# Patient Record
Sex: Female | Born: 1965 | Race: White | Hispanic: No | Marital: Married | Smoking: Never smoker
Health system: Southern US, Community
[De-identification: ages and names within clinical notes are randomized; demographics above are authoritative.]

## PROBLEM LIST (undated history)

## (undated) DIAGNOSIS — E7800 Pure hypercholesterolemia, unspecified: Secondary | ICD-10-CM

## (undated) DIAGNOSIS — E079 Disorder of thyroid, unspecified: Secondary | ICD-10-CM

## (undated) DIAGNOSIS — I471 Supraventricular tachycardia, unspecified: Secondary | ICD-10-CM

## (undated) DIAGNOSIS — K219 Gastro-esophageal reflux disease without esophagitis: Secondary | ICD-10-CM

## (undated) DIAGNOSIS — D126 Benign neoplasm of colon, unspecified: Secondary | ICD-10-CM

## (undated) HISTORY — DX: Disorder of thyroid, unspecified: E07.9

## (undated) HISTORY — DX: Gastro-esophageal reflux disease without esophagitis: K21.9

## (undated) HISTORY — PX: BREAST SURGERY: SHX581

## (undated) HISTORY — DX: Supraventricular tachycardia, unspecified: I47.10

## (undated) HISTORY — DX: Pure hypercholesterolemia, unspecified: E78.00

## (undated) HISTORY — DX: Benign neoplasm of colon, unspecified: D12.6

---

## 1998-10-02 ENCOUNTER — Other Ambulatory Visit: Admission: RE | Admit: 1998-10-02 | Discharge: 1998-10-02 | Payer: Self-pay | Admitting: Gynecology

## 1999-03-26 ENCOUNTER — Ambulatory Visit (HOSPITAL_COMMUNITY): Admission: RE | Admit: 1999-03-26 | Discharge: 1999-03-26 | Payer: Self-pay | Admitting: Internal Medicine

## 1999-10-16 ENCOUNTER — Other Ambulatory Visit: Admission: RE | Admit: 1999-10-16 | Discharge: 1999-10-16 | Payer: Self-pay | Admitting: Gynecology

## 2000-05-23 ENCOUNTER — Other Ambulatory Visit: Admission: RE | Admit: 2000-05-23 | Discharge: 2000-05-23 | Payer: Self-pay | Admitting: Gynecology

## 2000-12-22 ENCOUNTER — Emergency Department (HOSPITAL_COMMUNITY): Admission: EM | Admit: 2000-12-22 | Discharge: 2000-12-22 | Payer: Self-pay | Admitting: Emergency Medicine

## 2000-12-23 ENCOUNTER — Inpatient Hospital Stay (HOSPITAL_COMMUNITY): Admission: AD | Admit: 2000-12-23 | Discharge: 2000-12-25 | Payer: Self-pay | Admitting: Gynecology

## 2000-12-23 ENCOUNTER — Inpatient Hospital Stay (HOSPITAL_COMMUNITY): Admission: AD | Admit: 2000-12-23 | Discharge: 2000-12-23 | Payer: Self-pay | Admitting: Gynecology

## 2001-02-06 ENCOUNTER — Other Ambulatory Visit: Admission: RE | Admit: 2001-02-06 | Discharge: 2001-02-06 | Payer: Self-pay | Admitting: *Deleted

## 2001-08-07 ENCOUNTER — Ambulatory Visit (HOSPITAL_BASED_OUTPATIENT_CLINIC_OR_DEPARTMENT_OTHER): Admission: RE | Admit: 2001-08-07 | Discharge: 2001-08-07 | Payer: Self-pay | Admitting: Surgery

## 2001-08-07 ENCOUNTER — Encounter (INDEPENDENT_AMBULATORY_CARE_PROVIDER_SITE_OTHER): Payer: Self-pay | Admitting: *Deleted

## 2001-08-07 HISTORY — PX: BREAST EXCISIONAL BIOPSY: SUR124

## 2002-04-09 ENCOUNTER — Other Ambulatory Visit: Admission: RE | Admit: 2002-04-09 | Discharge: 2002-04-09 | Payer: Self-pay | Admitting: *Deleted

## 2003-02-11 ENCOUNTER — Encounter: Payer: Self-pay | Admitting: Surgery

## 2003-02-11 ENCOUNTER — Encounter: Admission: RE | Admit: 2003-02-11 | Discharge: 2003-02-11 | Payer: Self-pay | Admitting: Surgery

## 2003-04-16 ENCOUNTER — Other Ambulatory Visit: Admission: RE | Admit: 2003-04-16 | Discharge: 2003-04-16 | Payer: Self-pay | Admitting: Gynecology

## 2003-08-20 ENCOUNTER — Encounter: Admission: RE | Admit: 2003-08-20 | Discharge: 2003-08-20 | Payer: Self-pay | Admitting: Surgery

## 2003-08-20 ENCOUNTER — Encounter (INDEPENDENT_AMBULATORY_CARE_PROVIDER_SITE_OTHER): Payer: Self-pay | Admitting: Specialist

## 2003-08-20 HISTORY — PX: BREAST BIOPSY: SHX20

## 2004-04-27 ENCOUNTER — Other Ambulatory Visit: Admission: RE | Admit: 2004-04-27 | Discharge: 2004-04-27 | Payer: Self-pay | Admitting: Gynecology

## 2004-05-01 ENCOUNTER — Encounter: Admission: RE | Admit: 2004-05-01 | Discharge: 2004-05-01 | Payer: Self-pay | Admitting: Gynecology

## 2004-07-28 ENCOUNTER — Encounter: Admission: RE | Admit: 2004-07-28 | Discharge: 2004-07-28 | Payer: Self-pay | Admitting: Surgery

## 2005-04-29 ENCOUNTER — Other Ambulatory Visit: Admission: RE | Admit: 2005-04-29 | Discharge: 2005-04-29 | Payer: Self-pay | Admitting: Gynecology

## 2005-09-14 ENCOUNTER — Encounter: Admission: RE | Admit: 2005-09-14 | Discharge: 2005-09-14 | Payer: Self-pay | Admitting: Gynecology

## 2006-04-26 ENCOUNTER — Other Ambulatory Visit: Admission: RE | Admit: 2006-04-26 | Discharge: 2006-04-26 | Payer: Self-pay | Admitting: Gynecology

## 2006-11-02 ENCOUNTER — Encounter: Admission: RE | Admit: 2006-11-02 | Discharge: 2006-11-02 | Payer: Self-pay | Admitting: Family Medicine

## 2007-05-01 ENCOUNTER — Other Ambulatory Visit: Admission: RE | Admit: 2007-05-01 | Discharge: 2007-05-01 | Payer: Self-pay | Admitting: Gynecology

## 2008-01-03 ENCOUNTER — Encounter: Admission: RE | Admit: 2008-01-03 | Discharge: 2008-01-03 | Payer: Self-pay | Admitting: Gynecology

## 2008-05-10 ENCOUNTER — Ambulatory Visit: Payer: Self-pay | Admitting: Women's Health

## 2008-05-10 ENCOUNTER — Encounter: Payer: Self-pay | Admitting: Women's Health

## 2008-05-10 ENCOUNTER — Other Ambulatory Visit: Admission: RE | Admit: 2008-05-10 | Discharge: 2008-05-10 | Payer: Self-pay | Admitting: Gynecology

## 2008-07-30 ENCOUNTER — Ambulatory Visit: Payer: Self-pay | Admitting: Women's Health

## 2009-01-06 ENCOUNTER — Encounter: Admission: RE | Admit: 2009-01-06 | Discharge: 2009-01-06 | Payer: Self-pay | Admitting: Family Medicine

## 2009-05-20 ENCOUNTER — Encounter: Admission: RE | Admit: 2009-05-20 | Discharge: 2009-05-20 | Payer: Self-pay | Admitting: Family Medicine

## 2009-06-03 ENCOUNTER — Encounter (INDEPENDENT_AMBULATORY_CARE_PROVIDER_SITE_OTHER): Payer: Self-pay | Admitting: Interventional Radiology

## 2009-06-03 ENCOUNTER — Encounter: Admission: RE | Admit: 2009-06-03 | Discharge: 2009-06-03 | Payer: Self-pay | Admitting: Family Medicine

## 2009-06-03 ENCOUNTER — Other Ambulatory Visit: Admission: RE | Admit: 2009-06-03 | Discharge: 2009-06-03 | Payer: Self-pay | Admitting: Diagnostic Radiology

## 2009-06-17 ENCOUNTER — Ambulatory Visit: Payer: Self-pay | Admitting: Women's Health

## 2009-06-17 ENCOUNTER — Other Ambulatory Visit: Admission: RE | Admit: 2009-06-17 | Discharge: 2009-06-17 | Payer: Self-pay | Admitting: Gynecology

## 2009-06-17 ENCOUNTER — Encounter: Payer: Self-pay | Admitting: Women's Health

## 2009-08-07 ENCOUNTER — Encounter (HOSPITAL_COMMUNITY): Admission: RE | Admit: 2009-08-07 | Discharge: 2009-10-06 | Payer: Self-pay | Admitting: Internal Medicine

## 2009-09-30 HISTORY — PX: THYROIDECTOMY, PARTIAL: SHX18

## 2009-10-16 ENCOUNTER — Encounter (INDEPENDENT_AMBULATORY_CARE_PROVIDER_SITE_OTHER): Payer: Self-pay | Admitting: Surgery

## 2009-10-16 ENCOUNTER — Ambulatory Visit (HOSPITAL_COMMUNITY): Admission: RE | Admit: 2009-10-16 | Discharge: 2009-10-17 | Payer: Self-pay | Admitting: Surgery

## 2009-11-04 ENCOUNTER — Ambulatory Visit: Payer: Self-pay | Admitting: Gynecology

## 2010-01-12 ENCOUNTER — Encounter: Admission: RE | Admit: 2010-01-12 | Discharge: 2010-01-12 | Payer: Self-pay | Admitting: Family Medicine

## 2010-01-14 ENCOUNTER — Encounter: Admission: RE | Admit: 2010-01-14 | Discharge: 2010-01-14 | Payer: Self-pay | Admitting: Family Medicine

## 2010-08-23 ENCOUNTER — Encounter: Payer: Self-pay | Admitting: Family Medicine

## 2010-09-07 ENCOUNTER — Encounter: Payer: BC Managed Care – PPO | Admitting: Women's Health

## 2010-09-07 ENCOUNTER — Other Ambulatory Visit: Payer: Self-pay | Admitting: Women's Health

## 2010-09-07 ENCOUNTER — Other Ambulatory Visit (HOSPITAL_COMMUNITY)
Admission: RE | Admit: 2010-09-07 | Discharge: 2010-09-07 | Disposition: A | Discharge status: Home / Self Care | Payer: BC Managed Care – PPO | Source: Ambulatory Visit | Attending: Gynecology | Admitting: Gynecology

## 2010-09-07 DIAGNOSIS — Z01419 Encounter for gynecological examination (general) (routine) without abnormal findings: Secondary | ICD-10-CM

## 2010-09-07 DIAGNOSIS — Z124 Encounter for screening for malignant neoplasm of cervix: Secondary | ICD-10-CM | POA: Insufficient documentation

## 2010-10-26 LAB — DIFFERENTIAL
Basophils Absolute: 0 10*3/uL (ref 0.0–0.1)
Basophils Relative: 1 % (ref 0–1)
Eosinophils Absolute: 0.2 10*3/uL (ref 0.0–0.7)
Eosinophils Relative: 7 % — ABNORMAL HIGH (ref 0–5)
Lymphocytes Relative: 30 % (ref 12–46)
Lymphs Abs: 1 10*3/uL (ref 0.7–4.0)
Monocytes Absolute: 0.3 10*3/uL (ref 0.1–1.0)
Monocytes Relative: 8 % (ref 3–12)
Neutro Abs: 1.8 10*3/uL (ref 1.7–7.7)
Neutrophils Relative %: 54 % (ref 43–77)

## 2010-10-26 LAB — CBC
HCT: 39.8 % (ref 36.0–46.0)
Hemoglobin: 13.1 g/dL (ref 12.0–15.0)
MCHC: 32.8 g/dL (ref 30.0–36.0)
MCV: 93.7 fL (ref 78.0–100.0)
Platelets: 407 10*3/uL — ABNORMAL HIGH (ref 150–400)
RBC: 4.24 MIL/uL (ref 3.87–5.11)
RDW: 12.2 % (ref 11.5–15.5)
WBC: 3.4 10*3/uL — ABNORMAL LOW (ref 4.0–10.5)

## 2010-10-26 LAB — URINALYSIS, ROUTINE W REFLEX MICROSCOPIC
Bilirubin Urine: NEGATIVE
Glucose, UA: NEGATIVE mg/dL
Hgb urine dipstick: NEGATIVE
Ketones, ur: NEGATIVE mg/dL
Nitrite: NEGATIVE
Protein, ur: NEGATIVE mg/dL
Specific Gravity, Urine: 1.007 (ref 1.005–1.030)
Urobilinogen, UA: 0.2 mg/dL (ref 0.0–1.0)
pH: 7 (ref 5.0–8.0)

## 2010-10-26 LAB — PREGNANCY, URINE: Preg Test, Ur: NEGATIVE

## 2010-10-26 LAB — PROTIME-INR
INR: 1.01 (ref 0.00–1.49)
Prothrombin Time: 13.2 seconds (ref 11.6–15.2)

## 2010-10-26 LAB — BASIC METABOLIC PANEL
BUN: 11 mg/dL (ref 6–23)
CO2: 29 mEq/L (ref 19–32)
Calcium: 9.2 mg/dL (ref 8.4–10.5)
Chloride: 105 mEq/L (ref 96–112)
Creatinine, Ser: 0.87 mg/dL (ref 0.4–1.2)
GFR calc Af Amer: >60 mL/min (ref >60)
GFR calc non Af Amer: >60 mL/min (ref >60)
Glucose, Bld: 85 mg/dL (ref 70–99)
Potassium: 3.7 mEq/L (ref 3.5–5.1)
Sodium: 140 mEq/L (ref 135–145)

## 2010-12-18 NOTE — Op Note (Signed)
Hayley Stewart. [facility]  Patient:    [name], [name] Visit Number: [id number] MRN: [id number]          Service Type: DSU Location: [facility] Attending Physician:  Katha Cabal Dictated by:   Thornton Park Daphine Deutscher, M.D. Proc. Date: 08/17/01 Admit Date:  08/07/2001   CC:         Neta Mends. Panosh, M.D. [facility]  Timothy P. Fontaine, M.D.   Operative Report  PREOPERATIVE DIAGNOSIS:  Probable mass right breast at 6 oclock position.  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  SURGEON:  Thornton Park. Daphine Deutscher, M.D.  ANESTHESIA:  MAC  DESCRIPTION OF PROCEDURE:  [name] is a 45 year old lady with a palpable mass in the 6 oclock position in the retroareolar region on the right side. The area was localized with her assistance in operating room #8 and then she was given intravenous sedation.  The right breast was prepped with Betadine and draped sterilely.  I infiltrated the nipple with 1% Lidocaine and then made a small curvilinear incision.  I went down through the periareolar tissue.  I could palpate the small mass and dissected it free from the surrounding tissue.  Grossly it appeared to be a fibroadenoma.  It was sent in toto for permanent sections.  Bleeding was controlled with the electrocautery and then the wound was closed with 4-0 Vicryl subcutaneously and then with 5-0 subcuticular Vicryl with Benzoin and Steri-Strips on the skin.  The patient will be given Percocet 7.5/325 for pain and will be followed up in the office in two weeks. Dictated by:   Thornton Park Daphine Deutscher, M.D. Attending Physician:  Katha Cabal DD:  08/07/00 TD:  08/07/01 Job: [phone] [id number]

## 2010-12-18 NOTE — Discharge Summary (Signed)
[facility]  Patient:    Hayley Stewart, Hayley Stewart                        MRN: [id number] Adm. Date:  12/23/00 Disc. Date: 12/25/00 Attending:  Wetzel Bjornstad Dictator:   Antony Contras, N.P.                           Discharge Summary  DISCHARGE DIAGNOSES:          Intrauterine pregnancy at 41 weeks, prodromal labor, thin meconium, positive GBS.  PROCEDURE:                    Induction of labor, amnioinfusion, normal spontaneous vaginal delivery of viable over midline episiotomy with fourth degree laceration.  HISTORY OF PRESENT ILLNESS:   Patient is a 45 year old gravida 2, para 0-0-1-0 with LMP March 11, 2000, [facility] Dec 11, 2000.  Prenatal risk factors include history of advanced maternal age.  Also, patient had a benign breast nodule which was evaluated by Dr. Wenda Low.  PRENATAL LABORATORIES:        Blood type B+.  Antibody screen negative.  RPR, HBSAG, HIV nonreactive.  Rubella immune.  GBS positive.  MSAFP and amniocentesis normal.  HOSPITAL COURSE:              Patient was admitted for induction of labor at 41 weeks secondary to post dates, history of prolonged prodromal labor. Cervix on admission was 1 cm, 90%, -3.  Artificial rupture of membranes revealed thin meconium.  Patient did receive amnioinfusion.  She did progress to complete dilatation.  Was delivered of an Apgar 8/9 female infant weighing 6 pounds 8 ounces over midline episiotomy with a fourth degree laceration. Placenta was delivered intact.  Postpartum patient remained afebrile, was able to be discharged on her second postpartum day in satisfactory condition.  LABORATORIES:                 CBC:  Hematocrit 29.7, hemoglobin 10.3, WBC 12.9, platelets 296,000.  DISPOSITION:                  Follow-up in six weeks.  Continue prenatal vitamins and iron, Motrin and Tylox for pain, Colace and Metamucil, sitz baths for fourth degree. DD:  01/09/01 TD:  01/09/01 Job:  [id number]

## 2011-05-18 ENCOUNTER — Other Ambulatory Visit: Payer: Self-pay | Admitting: Gynecology

## 2011-05-18 DIAGNOSIS — Z1231 Encounter for screening mammogram for malignant neoplasm of breast: Secondary | ICD-10-CM

## 2011-05-31 ENCOUNTER — Ambulatory Visit (INDEPENDENT_AMBULATORY_CARE_PROVIDER_SITE_OTHER): Payer: BC Managed Care – PPO | Admitting: Women's Health

## 2011-05-31 ENCOUNTER — Encounter: Payer: Self-pay | Admitting: Women's Health

## 2011-05-31 VITALS — BP 110/70

## 2011-05-31 DIAGNOSIS — E041 Nontoxic single thyroid nodule: Secondary | ICD-10-CM | POA: Insufficient documentation

## 2011-05-31 DIAGNOSIS — B373 Candidiasis of vulva and vagina: Secondary | ICD-10-CM

## 2011-05-31 DIAGNOSIS — E079 Disorder of thyroid, unspecified: Secondary | ICD-10-CM | POA: Insufficient documentation

## 2011-05-31 DIAGNOSIS — B3731 Acute candidiasis of vulva and vagina: Secondary | ICD-10-CM

## 2011-05-31 DIAGNOSIS — R350 Frequency of micturition: Secondary | ICD-10-CM

## 2011-05-31 MED ORDER — FLUCONAZOLE 150 MG PO TABS: 150.0000 mg | ORAL_TABLET | Freq: Once | ORAL | Status: AC

## 2011-05-31 NOTE — Progress Notes (Signed)
  Presents with a complaint of urinary frequency and urgency for 2 days. Denies any burning, pain, or pain at end of stream. Denies fever or discharge. Symptoms started after intercourse.  Exam: No CVAT, UA completely negative. Abdomen was soft nontender, speculum exam moderate amount of white discharge was noted. Wet prep positive for yeast. Bimanual no CMT or adnexal fullness or tenderness. Contraceptives with vasectomy.  Plan: Urine culture. Diflucan 150 by mouth x1 dose with a refill. Yeast prevention discussed. Instructed to  call if symptoms do not resolve.

## 2011-06-07 ENCOUNTER — Ambulatory Visit
Admission: RE | Admit: 2011-06-07 | Discharge: 2011-06-07 | Disposition: A | Discharge status: Home / Self Care | Payer: BC Managed Care – PPO | Source: Ambulatory Visit | Attending: Gynecology | Admitting: Gynecology

## 2011-06-07 DIAGNOSIS — Z1231 Encounter for screening mammogram for malignant neoplasm of breast: Secondary | ICD-10-CM

## 2011-06-23 ENCOUNTER — Ambulatory Visit (INDEPENDENT_AMBULATORY_CARE_PROVIDER_SITE_OTHER): Payer: BC Managed Care – PPO | Admitting: Family Medicine

## 2011-06-23 ENCOUNTER — Encounter: Payer: Self-pay | Admitting: Family Medicine

## 2011-06-23 VITALS — BP 120/76 | HR 68 | Ht 62.5 in | Wt 124.0 lb

## 2011-06-23 DIAGNOSIS — E039 Hypothyroidism, unspecified: Secondary | ICD-10-CM

## 2011-06-23 NOTE — Progress Notes (Signed)
Patient had a partial thyroidectomy and has been under the care of Dr. Sharl Ma.  She has gained 10 pounds since her surgery, 3 pounds since August.  She feels her concerns about her weight aren't being addressed.  Diet and exercise have not changed.  Continues to run and mountain bike 4-5 days/week, and eats healthy, no change in diet.  Denies any increase in weight training. Had to buy bigger pants, and even these are now getting tighter.  Complains of constipation, continued weight gain.   Had a visit with Dr. Sharl Ma 10/1 TSH 1.77, free T4 1.22--her dose prior to visit was 4 days/week of , and 3 days/week of Her dose was then changed to 75 mcg every day.  11/15 TSH 2.96, free T4 0.95.  Denies any missed doses.  Takes it on empty stomach with no other medications. She got a call from the nurse, and recommends increasing dose to 88 mcg.  She is wondering why the TSH got higher, despite higher dose of thyroid medication.  She is wondering if increasing to is the right thing to do, seeking my opinion.  Med was called to pharmacy yesterday, hasn't picked it up yet.  Past Medical History  Diagnosis Date  . Thyroid disease 09/2009    hypothyroidism post op  . Osteoporosis 2004  . 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 Breast surgery     right breast biospy  . Thyroidectomy, partial 09/2009    History   Social History  . Marital Status: Married    Spouse Name: N/A    Number of Children: 1  . Years of Education: N/A   Occupational History  . Not on file.   Social History Main Topics  . Smoking status: Never Smoker   . Smokeless tobacco: Never Used  . Alcohol Use: Yes     1 drink per month.  . Drug Use: No  . Sexually Active: Yes -- Female partner(s)    Birth Control/ Protection: Surgical     husband  vasectomy   Other Topics Concern  . Not on file   Social History Narrative  . No narrative on file    Family History    Problem Relation Age of Onset  . Hypertension Mother   . Hyperlipidemia Mother   . Heart disease Maternal Grandfather     Current outpatient prescriptions:Calcium Carbonate-Vitamin D (CALCIUM 600+D) 600-400 MG-UNIT per tablet, Take 1 tablet by mouth daily.  , Disp: , Rfl: ;  levothyroxine (SYNTHROID, LEVOTHROID) 75 MCG tablet, Take 75 mcg by mouth daily.  , Disp: , Rfl:   Allergies  Allergen Reactions  . Macrobid Nausea And Vomiting  . Penicillins Other (See Comments)    Historical from mother.   ROS:  Denies fevers, URI symptoms, chest pain, shortness of breath, edema, skin concerns.  She reports that her energy is good, no trouble sleeping.  Denies skin or hair changes, moods are good.  Periods have been abnormal, ranging from 21-28 days, and sometimes is heavy. Denies GI or GU complaints, joint complaints, depression or other concerns  PHYSICAL EXAM: BP 120/76  Pulse 68  Ht 5' 2.5" (1.588 m)  Wt 124 lb (56.246 kg)  BMI 22.32 kg/m2  LMP 06/08/2011 Well developed, pleasant female in no distress HEENT:  PERRL, EOMI, conjunctiva clear.  OP normal Neck: no lymphadenopathy, thyromegaly or mass.  Thyroid not palpable Heart: regular rate and rhythm without  murmur Lungs: clear bilaterally Back: spine nontender, no CVA tenderness Abdomen: nontender, no organomegaly or mass Extremities: no edema, 2+ pulses Skin: no rashes Psych: normal mood, affect, hygiene and grooming  ASSESSMENT/PLAN:  1. Unspecified hypothyroidism    Unclear why TSH increased despite higher dose of thyroid medication.  It appears that Dr. Sharl Ma IS responding to her concerns of weight gain, by further increasing her thyroid dose.  I agree with plan to increase Synthroid to 88 mcg--discussed importance of repeat labs in 6-8 weeks to ensure that she isn't on too high of a dose.  Continue proper diet and exercise  Constipation--whether it is related to thyroid issues or not, we discussed adequate fluid intake, fiber  intake, role of probiotics.  Consider stool softeners, and consider Miralax if ongoing problems.  F/u to discuss other treatments if ongoing/worsening issues with constipation despite adequate treatment with thyroid medication

## 2011-08-18 ENCOUNTER — Encounter: Payer: Self-pay | Admitting: Family Medicine

## 2011-09-09 ENCOUNTER — Encounter: Payer: BC Managed Care – PPO | Admitting: Women's Health

## 2011-09-23 ENCOUNTER — Encounter: Payer: BC Managed Care – PPO | Admitting: Women's Health

## 2011-09-30 ENCOUNTER — Encounter: Payer: Self-pay | Admitting: Women's Health

## 2011-09-30 ENCOUNTER — Ambulatory Visit (INDEPENDENT_AMBULATORY_CARE_PROVIDER_SITE_OTHER): Payer: BC Managed Care – PPO | Admitting: Women's Health

## 2011-09-30 VITALS — BP 100/70 | Ht 62.25 in | Wt 125.0 lb

## 2011-09-30 DIAGNOSIS — Z01419 Encounter for gynecological examination (general) (routine) without abnormal findings: Secondary | ICD-10-CM

## 2011-09-30 NOTE — Patient Instructions (Signed)

## 2011-09-30 NOTE — Progress Notes (Signed)
Hayley Stewart [birth year] [id number]    History:    The patient presents for annual exam.  Monthly 3 day cycle/vasectomy without complaint. Working harder to maintain weight since hypothyroid. History of normal Paps, history of fibroadenoma the right breast.   Past medical history, past surgical history, family history and social history were all reviewed and documented in the EPIC chart. History of a  partial thyroidectomy, thyroid nodule with atypical cells in 2010, Dr. Sharl Ma manages. History of osteopenia/osteoporosis diagnosed in 04, was placed on Fosamax per primary care for a Z. score of -2.5. Followup DEXA showed a Z score of -1.3, has been off medications since 08. These tests were done before menopause. Mother with history of osteoporosis.   ROS:  A  ROS was performed and pertinent positives and negatives are included in the history.  Exam:  Filed Vitals:   09/30/11 0930  BP: 100/70    General appearance:  Normal Head/Neck:  Normal, without cervical or supraclavicular adenopathy. Thyroid:  Symmetrical, normal in size, without palpable masses or nodularity. Respiratory  Effort:  Normal  Auscultation:  Clear without wheezing or rhonchi Cardiovascular  Auscultation:  Regular rate, without rubs, murmurs or gallops  Edema/varicosities:  Not grossly evident Abdominal  Soft,nontender, without masses, guarding or rebound.  Liver/spleen:  No organomegaly noted  Hernia:  None appreciated  Skin  Inspection:  Grossly normal  Palpation:  Grossly normal Neurologic/psychiatric  Orientation:  Normal with appropriate conversation.  Mood/affect:  Normal  Genitourinary    Breasts: Examined lying and sitting.     Right: Without masses, retractions, discharge or axillary adenopathy.     Left: Without masses, retractions, discharge or axillary adenopathy.   Inguinal/mons:  Normal without inguinal adenopathy  External genitalia:  Normal  BUS/Urethra/Skene's glands:  Normal  Bladder:   Normal  Vagina:  Normal  Cervix:  Normal  Uterus:  normal in size, shape and contour.  Midline and mobile  Adnexa/parametria:     Rt: Without masses or tenderness.   Lt: Without masses or tenderness.  Anus and perineum: Normal  Digital rectal exam: Normal sphincter tone without palpated masses or tenderness  Assessment/Plan:  46 y.o. MWF G1 P1 for annual exam.     Normal GYN exam/vasectomy Hypothyroidism/Synthroid per Dr. Sharl Ma  Plan: SBE's, annual mammogram, calcium rich diet, continue exercise/ healthy lifestyle, vitamin D 1000 daily encouraged. Had screening labs for insurance, all labs were normal. TSH and thyroid function monitored at Dr. Daune Perch office. Continue menstrual calendar instructed to call if cycles less than every 21 days. ACOGs Pap recommendations reviewed.   Harrington Challenger WHNP, 10:12 AM 09/30/2011

## 2012-04-20 ENCOUNTER — Institutional Professional Consult (permissible substitution): Payer: BC Managed Care – PPO | Admitting: Family Medicine

## 2012-04-20 ENCOUNTER — Encounter: Payer: Self-pay | Admitting: Family Medicine

## 2012-04-20 ENCOUNTER — Ambulatory Visit (INDEPENDENT_AMBULATORY_CARE_PROVIDER_SITE_OTHER): Payer: BC Managed Care – PPO | Admitting: Family Medicine

## 2012-04-20 VITALS — BP 118/72 | HR 68 | Ht 61.75 in | Wt 120.0 lb

## 2012-04-20 DIAGNOSIS — F411 Generalized anxiety disorder: Secondary | ICD-10-CM | POA: Insufficient documentation

## 2012-04-20 DIAGNOSIS — E079 Disorder of thyroid, unspecified: Secondary | ICD-10-CM

## 2012-04-20 MED ORDER — ALPRAZOLAM 0.25 MG PO TABS: 0.2500 mg | ORAL_TABLET | Freq: Three times a day (TID) | ORAL | Status: DC | PRN

## 2012-04-20 NOTE — Patient Instructions (Addendum)
Use the alprazolam sparingly.  Can cause sedation so start out low, and increase dose as needed.  If you find yourself needing it frequently to control anxiety symptoms, then return for discussion of preventative medications.  Also consider behavioral therapy, and you can call for counselor recommendation.  If you notice irregular heartbeat, frequent episodes of fast heart rate that trigger the anxiety (rather than anxiety triggering the fast heart rate), then call for referral to Dr. Nadara Eaton for event monitor, to evaluate for arrhythmias.

## 2012-04-20 NOTE — Progress Notes (Signed)
Chief Complaint  Patient presents with  . Panic Attack    experienced a panic attack while in [place] this summer. Thinks it was due to her thyroid level. In July had another at the beach. Since then her thyroid level has come up-still having some smaller panic attacks. A little on edge, states general anxiety. Wakes her up in the middle of the night. Pt declines flu vaccine at this time.   HPI:  First episode was while visiting her mother in [place].  Prior to her trip, she saw Dr. Sharl Ma and her TSH was 0.10 at that time, she was told it was okay, and to continue current dose.  She didn't feel quite right the evening prior to the episode, and then during the middle of the night she woke up with her heart racing, anxious, "I'm dying", sweating, massive diarrhea, shaking.  Didn't have chest pain or shortness of breath. Her sister-in-law is an ER nurse, and was there with her, and told her it was a panic attack and reassured her.  She was given benadryl, which eventually helped some.  Upon return from [place], she saw Dr. Sharl Ma and had meds adjusted.  2 weeks after dose change she had another episode while at the beach.  Occurred while eating dinner--felt wave of anxiety/panic, and knew what was about to happen from previous episode, and got more anxious.  She took one of her friend's clonazepam (friend was a Engineer, civil (consulting)), which helped take the edge off.   Last TSH was 0.51 in July on recheck.  Most days she feels completely fine, but still has times where she will get twinges of anxiety, even if just sitting in carpool line.  She has had echo and stress testing in the past, for episodes of tachycardia and palpitations--she now realizes that perhaps this was all part of anxiety.  She sometimes gets a sensation that feels like someone pulled a chair out from under her, which occurs sometimes while partially reclined in bed. She has been able to relax herself and prevent further anxiety when feels this way the last few  times.  Father has anxiety and panic attacks (related to elevators, driving on bridges, etc).  She reports having a narrow window of where she feels good on thyroid, requiring frequent dose changes with Dr. Anastasia Fiedler issues with constipation with TSH at 3.  Past Medical History  Diagnosis Date  . Thyroid disease 09/2009    hypothyroidism post op  . Osteoporosis 2004  . 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 Laryngopharyngeal reflux     Dr. Jenne Pane (ENT)  . Anxiety    Past Surgical History  Procedure Date  . Thyroidectomy, partial 09/2009    R lobectomy/BENIGN  . Breast surgery     right breast biopsy/FIBROMA OF RT BREAST   Current Outpatient Prescriptions on File Prior to Visit  Medication Sig Dispense Refill  . Calcium Carbonate-Vitamin D (CALCIUM 600+D) 600-400 MG-UNIT per tablet Take 1 tablet by mouth daily.        Marland Kitchen levothyroxine (SYNTHROID, LEVOTHROID) 75 MCG tablet Take 75 mcg by mouth daily. Mon-Sat       Allergies  Allergen Reactions  . Nitrofurantoin Monohyd Macro Nausea And Vomiting  . Penicillins Other (See Comments)    Historical from mother.   ROS: Denies depression, weight changes, hair/skin changes, fevers, URI symptoms, shortness of breath, chest pain, skin rash, or other concerns except as per HPI.  PHYSICAL EXAM: BP 118/72  Pulse 68  Ht 5' 1.75" (1.568 m)  Wt 120 lb (54.432 kg)  BMI 22.13 kg/m2  LMP 03/13/2012 Well developed, pleasant female in no distress Neck: no lymphadenopathy, thyromegaly or mass Heart: regular rate and rhythm without murmur or ectopy.  Prominent sinus arrhythmia Extremities: no edema Skin: no rash  ASSESSMENT/PLAN:  1. Anxiety state, unspecified  ALPRAZolam (XANAX) 0.25 MG tablet  2. Thyroid disease     Xanax prn for panic attacks.  Risks and side effects reviewed.  Counseled regarding relaxation techniques. Possibly being overreplaced on her thyroid contributed to some  tachycardia and symptoms prior to first episode.  Discussed meds for anxiety in detail (alprazolam, as well as using SSRI's or other preventative meds), as well as possibiity of behavioral techniques/counseling  Consider event monitor if ongoing concerns, if notices irregular heartbeat or tachycardia.  F/u prn 30 minute visit, all counseling.

## 2012-05-10 ENCOUNTER — Other Ambulatory Visit: Payer: Self-pay | Admitting: Gynecology

## 2012-05-10 DIAGNOSIS — Z1231 Encounter for screening mammogram for malignant neoplasm of breast: Secondary | ICD-10-CM

## 2012-06-20 ENCOUNTER — Ambulatory Visit
Admission: RE | Admit: 2012-06-20 | Discharge: 2012-06-20 | Disposition: A | Discharge status: Home / Self Care | Payer: BC Managed Care – PPO | Source: Ambulatory Visit | Attending: Gynecology | Admitting: Gynecology

## 2012-06-20 DIAGNOSIS — Z1231 Encounter for screening mammogram for malignant neoplasm of breast: Secondary | ICD-10-CM

## 2012-07-19 ENCOUNTER — Telehealth: Payer: Self-pay | Admitting: Family Medicine

## 2012-07-19 NOTE — Telephone Encounter (Signed)
Left message for patient to return my call.

## 2012-07-19 NOTE — Telephone Encounter (Signed)
Pt called and stated that she was told at her office visit that if she needed something for her nerves on a daily basis she needed to call the office. Pt uses [facility] [address]

## 2012-07-19 NOTE — Telephone Encounter (Signed)
This is what she was told at last visit: "If you find yourself needing it frequently to control anxiety symptoms, then return for discussion of preventative medications. Also consider behavioral therapy, and you can call for counselor recommendation." This leads me to believe that I didn't discuss meds (risks/side effects, etc) in detail, so I'd prefer she makes appointment to discuss the meds in more detail (there are many to choose from, with various side effects)

## 2012-07-20 ENCOUNTER — Telehealth: Payer: Self-pay | Admitting: *Deleted

## 2012-07-20 NOTE — Telephone Encounter (Signed)
Spoke with patient and let her know Dr.Knapp's response. She stated that she cannot schedule an appt until after the first of the year due to her insurance quota being full. She will call back to schedule appt at later date.

## 2012-08-03 NOTE — Telephone Encounter (Signed)
08/03/2012 °

## 2012-10-04 ENCOUNTER — Encounter: Payer: BC Managed Care – PPO | Admitting: Women's Health

## 2012-10-11 ENCOUNTER — Ambulatory Visit (INDEPENDENT_AMBULATORY_CARE_PROVIDER_SITE_OTHER): Payer: Managed Care, Other (non HMO) | Admitting: Women's Health

## 2012-10-11 ENCOUNTER — Encounter: Payer: Self-pay | Admitting: Women's Health

## 2012-10-11 VITALS — BP 114/62 | Ht 62.25 in | Wt 120.0 lb

## 2012-10-11 DIAGNOSIS — Z01419 Encounter for gynecological examination (general) (routine) without abnormal findings: Secondary | ICD-10-CM

## 2012-10-11 NOTE — Progress Notes (Signed)
Hayley Stewart 18-Jul-1966 [id number]    History:    The patient presents for annual exam.  Cycles every 21-30 days/vasectomy. History of benign right thyroid nodule with lumpectomy 2011 on Synthroid. History of normal Paps and mammograms. History of a right breast fibroadenoma. Treated by primary care for osteopenia with Boniva 2006 and Fosamax 2004-2006, Z. Score -1.3.   Past medical history, past surgical history, family history and social history were all reviewed and documented in the EPIC chart. Stay-at-home mom. Hayley Stewart 12 doing well. Parents hypertension. Mother osteoporosis.   ROS:  A  ROS was performed and pertinent positives and negatives are included in the history.  Exam:  Filed Vitals:   10/11/12 0929  BP: 114/62    General appearance:  Normal Head/Neck:  Normal, without cervical or supraclavicular adenopathy. Thyroid:  Symmetrical, normal in size, without palpable masses or nodularity. Respiratory  Effort:  Normal  Auscultation:  Clear without wheezing or rhonchi Cardiovascular  Auscultation:  Regular rate, without rubs, murmurs or gallops  Edema/varicosities:  Not grossly evident Abdominal  Soft,nontender, without masses, guarding or rebound.  Liver/spleen:  No organomegaly noted  Hernia:  None appreciated  Skin  Inspection:  Grossly normal  Palpation:  Grossly normal Neurologic/psychiatric  Orientation:  Normal with appropriate conversation.  Mood/affect:  Normal  Genitourinary    Breasts: Examined lying and sitting.     Right: Without masses, retractions, discharge or axillary adenopathy.     Left: Without masses, retractions, discharge or axillary adenopathy.   Inguinal/mons:  Normal without inguinal adenopathy  External genitalia:  Normal  BUS/Urethra/Skene's glands:  Normal  Bladder:  Normal  Vagina:  Normal  Cervix:  Normal  Uterus:   normal in size, shape and contour.  Midline and mobile  Adnexa/parametria:     Rt: Without masses or  tenderness.   Lt: Without masses or tenderness.  Anus and perineum: Normal  Digital rectal exam: Normal sphincter tone without palpated masses or tenderness  Assessment/Plan:  47 y.o. M. WF G1 P1 for annual exam with no complaints.  Normal GYN exam/vasectomy Hypothyroid-primary care labs and meds  Plan: SBE's, continue annual mammogram, calcium rich diet, vitamin D 2000 daily encouraged. Continue healthy diet/lifestyle/exercise. Pap normal 09/2010, new screening guidelines reviewed.    Harrington Challenger WHNP, 1:06 PM 10/11/2012

## 2012-10-11 NOTE — Patient Instructions (Addendum)

## 2012-10-12 LAB — URINALYSIS W MICROSCOPIC + REFLEX CULTURE
Bacteria, UA: NONE SEEN
Bilirubin Urine: NEGATIVE
Casts: NONE SEEN
Crystals: NONE SEEN
Glucose, UA: NEGATIVE mg/dL
Hgb urine dipstick: NEGATIVE
Ketones, ur: NEGATIVE mg/dL
Nitrite: NEGATIVE
Protein, ur: NEGATIVE mg/dL
Specific Gravity, Urine: 1.026 (ref 1.005–1.030)
Urobilinogen, UA: 0.2 mg/dL (ref 0.0–1.0)
pH: 5.5 (ref 5.0–8.0)

## 2012-10-13 LAB — URINE CULTURE
Colony Count: NO GROWTH
Organism ID, Bacteria: NO GROWTH

## 2013-01-19 ENCOUNTER — Encounter: Payer: Self-pay | Admitting: Women's Health

## 2013-01-19 ENCOUNTER — Ambulatory Visit (INDEPENDENT_AMBULATORY_CARE_PROVIDER_SITE_OTHER): Payer: Managed Care, Other (non HMO) | Admitting: Women's Health

## 2013-01-19 DIAGNOSIS — R300 Dysuria: Secondary | ICD-10-CM

## 2013-01-19 LAB — URINALYSIS W MICROSCOPIC + REFLEX CULTURE
Bilirubin Urine: NEGATIVE
Glucose, UA: NEGATIVE mg/dL
Hgb urine dipstick: NEGATIVE
Ketones, ur: NEGATIVE mg/dL
Leukocytes, UA: NEGATIVE
Nitrite: NEGATIVE
Protein, ur: NEGATIVE mg/dL
Specific Gravity, Urine: 1.025 (ref 1.005–1.030)
Urobilinogen, UA: 0.2 mg/dL (ref 0.0–1.0)
pH: 5.5 (ref 5.0–8.0)

## 2013-01-19 NOTE — Progress Notes (Signed)
Patient ID: Hayley Stewart, female   DOB: 03-Jun-1966, 48 y.o.   MRN: [id number] Resents with complaint of urine having stronger than usual odor, and darker in color. Denies any pain with urination, vaginal discharge, irritation, abdominal pain or fever. Noticed urine symptoms day after being [place] swim [place] in the [place] 8 [place]. Reports urine appears normal today.  Exam: Appears well. UA: Negative. Abdomen soft nontender, external genitalia within normal limits no erythema noted. Speculum exam no erythema or discharge noted.  Probable concentrated urine causing odor and darker color  Plan: Reviewed importance of increasing fluids in the heat.

## 2013-04-23 ENCOUNTER — Other Ambulatory Visit: Payer: Self-pay | Admitting: Family Medicine

## 2013-04-23 DIAGNOSIS — G43809 Other migraine, not intractable, without status migrainosus: Secondary | ICD-10-CM

## 2013-04-26 ENCOUNTER — Other Ambulatory Visit: Payer: Managed Care, Other (non HMO)

## 2013-05-21 ENCOUNTER — Other Ambulatory Visit: Payer: Self-pay

## 2013-05-21 DIAGNOSIS — Z1231 Encounter for screening mammogram for malignant neoplasm of breast: Secondary | ICD-10-CM

## 2013-07-02 ENCOUNTER — Ambulatory Visit
Admission: RE | Admit: 2013-07-02 | Discharge: 2013-07-02 | Disposition: A | Discharge status: Home / Self Care | Payer: Managed Care, Other (non HMO) | Source: Ambulatory Visit

## 2013-07-02 DIAGNOSIS — Z1231 Encounter for screening mammogram for malignant neoplasm of breast: Secondary | ICD-10-CM

## 2013-10-12 ENCOUNTER — Encounter: Payer: Managed Care, Other (non HMO) | Admitting: Women's Health

## 2013-10-16 ENCOUNTER — Ambulatory Visit (INDEPENDENT_AMBULATORY_CARE_PROVIDER_SITE_OTHER): Payer: Managed Care, Other (non HMO) | Admitting: Women's Health

## 2013-10-16 ENCOUNTER — Encounter: Payer: Self-pay | Admitting: Women's Health

## 2013-10-16 DIAGNOSIS — B373 Candidiasis of vulva and vagina: Secondary | ICD-10-CM

## 2013-10-16 DIAGNOSIS — N898 Other specified noninflammatory disorders of vagina: Secondary | ICD-10-CM

## 2013-10-16 DIAGNOSIS — B3731 Acute candidiasis of vulva and vagina: Secondary | ICD-10-CM

## 2013-10-16 DIAGNOSIS — IMO0001 Reserved for inherently not codable concepts without codable children: Secondary | ICD-10-CM

## 2013-10-16 DIAGNOSIS — R350 Frequency of micturition: Secondary | ICD-10-CM

## 2013-10-16 LAB — URINALYSIS W MICROSCOPIC + REFLEX CULTURE
Bilirubin Urine: NEGATIVE
Casts: NONE SEEN
Crystals: NONE SEEN
Glucose, UA: NEGATIVE mg/dL
Ketones, ur: NEGATIVE mg/dL
Leukocytes, UA: NEGATIVE
Nitrite: NEGATIVE
Specific Gravity, Urine: >1.03 — ABNORMAL HIGH (ref 1.005–1.030)
Urobilinogen, UA: 0.2 mg/dL (ref 0.0–1.0)
WBC, UA: NONE SEEN WBC/hpf (ref <3)
pH: 5 (ref 5.0–8.0)

## 2013-10-16 LAB — WET PREP FOR TRICH, YEAST, CLUE
Clue Cells Wet Prep HPF POC: NONE SEEN
Trich, Wet Prep: NONE SEEN

## 2013-10-16 MED ORDER — FLUCONAZOLE 150 MG PO TABS: 150.0000 mg | ORAL_TABLET | Freq: Once | ORAL | Status: DC

## 2013-10-16 NOTE — Progress Notes (Signed)
Patient ID: Hayley Stewart, female   DOB: Jan 31, 1966, 48 y.o.   MRN: [id number] Presents with complaint of vaginal irritation with itching. Mild burning with urination, no pain at end of stream. Denies abdominal pain or fever. Cycles becoming more irregular every 21 days to 45 days/vasectomy. Vaginal dryness with  intercourse.  Exam: Appears well. External genitalia extremely erythematous at introitus, speculum exam scant white discharge vaginal walls erythematous. Wet prep positive for moderate yeast. UA: Trace blood, 3-6 rbc's, few bacteria  Yeast vaginitis  Plan: Diflucan 150 by mouth times one dose with refill. Yea or st prevention discussed. Instructed to call if no relief of irritation. Keep scheduled annual exam next month. Encourage vaginal lubricants with intercourse. Urine culture pending.

## 2013-10-16 NOTE — Patient Instructions (Signed)
Monilial Vaginitis  Vaginitis in a soreness, swelling and redness (inflammation) of the vagina and vulva. Monilial vaginitis is not a sexually transmitted infection.  CAUSES   Yeast vaginitis is caused by yeast (candida) that is normally found in your vagina. With a yeast infection, the candida has overgrown in number to a point that upsets the chemical balance.  SYMPTOMS   · White, thick vaginal discharge.  · Swelling, itching, redness and irritation of the vagina and possibly the lips of the vagina (vulva).  · Burning or painful urination.  · Painful intercourse.  DIAGNOSIS   Things that may contribute to monilial vaginitis are:  · Postmenopausal and virginal states.  · Pregnancy.  · Infections.  · Being tired, sick or stressed, especially if you had monilial vaginitis in the past.  · Diabetes. Good control will help lower the chance.  · Birth control pills.  · Tight fitting garments.  · Using bubble bath, feminine sprays, douches or deodorant tampons.  · Taking certain medications that kill germs (antibiotics).  · Sporadic recurrence can occur if you become ill.  TREATMENT   Your caregiver will give you medication.  · There are several kinds of anti monilial vaginal creams and suppositories specific for monilial vaginitis. For recurrent yeast infections, use a suppository or cream in the vagina 2 times a week, or as directed.  · Anti-monilial or steroid cream for the itching or irritation of the vulva may also be used. Get your caregiver's permission.  · Painting the vagina with methylene blue solution may help if the monilial cream does not work.  · Eating yogurt may help prevent monilial vaginitis.  HOME CARE INSTRUCTIONS   · Finish all medication as prescribed.  · Do not have sex until treatment is completed or after your caregiver tells you it is okay.  · Take warm sitz baths.  · Do not douche.  · Do not use tampons, especially scented ones.  · Wear cotton underwear.  · Avoid tight pants and panty  hose.  · Tell your sexual partner that you have a yeast infection. They should go to their caregiver if they have symptoms such as mild rash or itching.  · Your sexual partner should be treated as well if your infection is difficult to eliminate.  · Practice safer sex. Use condoms.  · Some vaginal medications cause latex condoms to fail. Vaginal medications that harm condoms are:  · Cleocin cream.  · Butoconazole (Femstat®).  · Terconazole (Terazol®) vaginal suppository.  · Miconazole (Monistat®) (may be purchased over the counter).  SEEK MEDICAL CARE IF:   · You have a temperature by mouth above 102° F (38.9° C).  · The infection is getting worse after 2 days of treatment.  · The infection is not getting better after 3 days of treatment.  · You develop blisters in or around your vagina.  · You develop vaginal bleeding, and it is not your menstrual period.  · You have pain when you urinate.  · You develop intestinal problems.  · You have pain with sexual intercourse.  Document Released: 04/28/2005 Document Revised: 10/11/2011 Document Reviewed: 01/10/2009  Exit[employer]® Patient Information ©2014 [employer].

## 2013-10-17 LAB — URINE CULTURE
Colony Count: NO GROWTH
Organism ID, Bacteria: NO GROWTH

## 2013-11-14 ENCOUNTER — Other Ambulatory Visit (HOSPITAL_COMMUNITY)
Admission: RE | Admit: 2013-11-14 | Discharge: 2013-11-14 | Disposition: A | Discharge status: Home / Self Care | Payer: Managed Care, Other (non HMO) | Source: Ambulatory Visit | Attending: Gynecology | Admitting: Gynecology

## 2013-11-14 ENCOUNTER — Encounter: Payer: Self-pay | Admitting: Women's Health

## 2013-11-14 ENCOUNTER — Ambulatory Visit (INDEPENDENT_AMBULATORY_CARE_PROVIDER_SITE_OTHER): Payer: Managed Care, Other (non HMO) | Admitting: Women's Health

## 2013-11-14 VITALS — BP 116/70 | Ht 61.75 in | Wt 123.0 lb

## 2013-11-14 DIAGNOSIS — Z01419 Encounter for gynecological examination (general) (routine) without abnormal findings: Secondary | ICD-10-CM

## 2013-11-14 MED ORDER — TRIAMTERENE-HCTZ 37.5-25 MG PO CAPS: ORAL_CAPSULE | ORAL | Status: DC

## 2013-11-14 NOTE — Patient Instructions (Signed)
Health Recommendations for Postmenopausal Women Respected and ongoing research has looked at the most common causes of death, disability, and poor quality of life in postmenopausal women. The causes include heart disease, diseases of blood vessels, diabetes, depression, cancer, and bone loss (osteoporosis). Many things can be done to help lower the chances of developing these and other common problems: CARDIOVASCULAR DISEASE Heart Disease: A heart attack is a medical emergency. Know the signs and symptoms of a heart attack. Below are things women can do to reduce their risk for heart disease.   Do not smoke. If you smoke, quit.  Aim for a healthy weight. Being overweight causes many preventable deaths. Eat a healthy and balanced diet and drink an adequate amount of liquids.  Get moving. Make a commitment to be more physically active. Aim for 30 minutes of activity on most, if not all days of the week.  Eat for heart health. Choose a diet that is low in saturated fat and cholesterol and eliminate trans fat. Include whole grains, vegetables, and fruits. Read and understand the labels on food containers before buying.  Know your numbers. Ask your caregiver to check your blood pressure, cholesterol (total, HDL, LDL, triglycerides) and blood glucose. Work with your caregiver on improving your entire clinical picture.  High blood pressure. Limit or stop your table salt intake (try salt substitute and food seasonings). Avoid salty foods and drinks. Read labels on food containers before buying. Eating well and exercising can help control high blood pressure. STROKE  Stroke is a medical emergency. Stroke may be the result of a blood clot in a blood vessel in the brain or by a brain hemorrhage (bleeding). Know the signs and symptoms of a stroke. To lower the risk of developing a stroke:  Avoid fatty foods.  Quit smoking.  Control your diabetes, blood pressure, and irregular heart rate. THROMBOPHLEBITIS  (BLOOD CLOT) OF THE LEG  Becoming overweight and leading a stationary lifestyle may also contribute to developing blood clots. Controlling your diet and exercising will help lower the risk of developing blood clots. CANCER SCREENING  Breast Cancer: Take steps to reduce your risk of breast cancer.  You should practice "breast self-awareness." This means understanding the normal appearance and feel of your breasts and should include breast self-examination. Any changes detected, no matter how small, should be reported to your caregiver.  After age 40, you should have a clinical breast exam (CBE) every year.  Starting at age 40, you should consider having a mammogram (breast X-ray) every year.  If you have a family history of breast cancer, talk to your caregiver about genetic screening.  If you are at high risk for breast cancer, talk to your caregiver about having an MRI and a mammogram every year.  Intestinal or Stomach Cancer: Tests to consider are a rectal exam, fecal occult blood, sigmoidoscopy, and colonoscopy. Women who are high risk may need to be screened at an earlier age and more often.  Cervical Cancer:  Beginning at age 30, you should have a Pap test every 3 years as long as the past 3 Pap tests have been normal.  If you have had past treatment for cervical cancer or a condition that could lead to cancer, you need Pap tests and screening for cancer for at least 20 years after your treatment.  If you had a hysterectomy for a problem that was not cancer or a condition that could lead to cancer, then you no longer need Pap tests.    If you are between ages 65 and 70, and you have had normal Pap tests going back 10 years, you no longer need Pap tests.  If Pap tests have been discontinued, risk factors (such as a new sexual partner) need to be reassessed to determine if screening should be resumed.  Some medical problems can increase the chance of getting cervical cancer. In these  cases, your caregiver may recommend more frequent screening and Pap tests.  Uterine Cancer: If you have vaginal bleeding after reaching menopause, you should notify your caregiver.  Ovarian cancer: Other than yearly pelvic exams, there are no reliable tests available to screen for ovarian cancer at this time except for yearly pelvic exams.  Lung Cancer: Yearly chest X-rays can detect lung cancer and should be done on high risk women, such as cigarette smokers and women with chronic lung disease (emphysema).  Skin Cancer: A complete body skin exam should be done at your yearly examination. Avoid overexposure to the sun and ultraviolet light lamps. Use a strong sun block cream when in the sun. All of these things are important in lowering the risk of skin cancer. MENOPAUSE Menopause Symptoms: Hormone therapy products are effective for treating symptoms associated with menopause:  Moderate to severe hot flashes.  Night sweats.  Mood swings.  Headaches.  Tiredness.  Loss of sex drive.  Insomnia.  Other symptoms. Hormone replacement carries certain risks, especially in older women. Women who use or are thinking about using estrogen or estrogen with progestin treatments should discuss that with their caregiver. Your caregiver will help you understand the benefits and risks. The ideal dose of hormone replacement therapy is not known. The Food and Drug Administration (FDA) has concluded that hormone therapy should be used only at the lowest doses and for the shortest amount of time to reach treatment goals.  OSTEOPOROSIS Protecting Against Bone Loss and Preventing Fracture: If you use hormone therapy for prevention of bone loss (osteoporosis), the risks for bone loss must outweigh the risk of the therapy. Ask your caregiver about other medications known to be safe and effective for preventing bone loss and fractures. To guard against bone loss or fractures, the following is recommended:  If  you are less than age 50, take 1000 mg of calcium and at least 600 mg of Vitamin D per day.  If you are greater than age 50 but less than age 70, take 1200 mg of calcium and at least 600 mg of Vitamin D per day.  If you are greater than age 70, take 1200 mg of calcium and at least 800 mg of Vitamin D per day. Smoking and excessive alcohol intake increases the risk of osteoporosis. Eat foods rich in calcium and vitamin D and do weight bearing exercises several times a week as your caregiver suggests. DIABETES Diabetes Melitus: If you have Type I or Type 2 diabetes, you should keep your blood sugar under control with diet, exercise and recommended medication. Avoid too many sweets, starchy and fatty foods. Being overweight can make control more difficult. COGNITION AND MEMORY Cognition and Memory: Menopausal hormone therapy is not recommended for the prevention of cognitive disorders such as Alzheimer's disease or memory loss.  DEPRESSION  Depression may occur at any age, but is common in elderly women. The reasons may be because of physical, medical, social (loneliness), or financial problems and needs. If you are experiencing depression because of medical problems and control of symptoms, talk to your caregiver about this. Physical activity and   exercise may help with mood and sleep. Community and volunteer involvement may help your sense of value and worth. If you have depression and you feel that the problem is getting worse or becoming severe, talk to your caregiver about treatment options that are best for you. ACCIDENTS  Accidents are common and can be serious in the elderly woman. Prepare your house to prevent accidents. Eliminate throw rugs, place hand bars in the bath, shower and toilet areas. Avoid wearing high heeled shoes or walking on wet, snowy, and icy areas. Limit or stop driving if you have vision or hearing problems, or you feel you are unsteady with you movements and  reflexes. HEPATITIS C Hepatitis C is a type of viral infection affecting the liver. It is spread mainly through contact with blood from an infected person. It can be treated, but if left untreated, it can lead to severe liver damage over years. Many people who are infected do not know that the virus is in their blood. If you are a "baby-boomer", it is recommended that you have one screening test for Hepatitis C. IMMUNIZATIONS  Several immunizations are important to consider having during your senior years, including:   Tetanus, diptheria, and pertussis booster shot.  Influenza every year before the flu season begins.  Pneumonia vaccine.  Shingles vaccine.  Others as indicated based on your specific needs. Talk to your caregiver about these. Document Released: 09/10/2005 Document Revised: 07/05/2012 Document Reviewed: 05/06/2008 Exit[employer] Patient Information 2014 [employer].  

## 2013-11-14 NOTE — Progress Notes (Signed)
Hayley Stewart 02/25/1974 [id number]    History:    Presents for annual exam.  Cycles becoming more irregular every 20  to 50 days/vasectomy. Normal Pap and mammogram history. Benign adenoma right thyroid nodule continues followup with endocrinologist. Treated for osteopenia with Boniva and Fosamax per primary care 2004-2006 for a Z score of -1.3.  Past medical history, past surgical history, family history and social history were all reviewed and documented in the EPIC chart. Stay-at-home mom. [name] 13 doing well.  ROS:  A  ROS was performed and pertinent positives and negatives are included.  Exam:  Filed Vitals:   11/14/13 0948  BP: 116/70    General appearance:  Normal Thyroid:  Symmetrical, normal in size, without palpable masses or nodularity. Respiratory  Auscultation:  Clear without wheezing or rhonchi Cardiovascular  Auscultation:  Regular rate, without rubs, murmurs or gallops  Edema/varicosities:  Not grossly evident Abdominal  Soft,nontender, without masses, guarding or rebound.  Liver/spleen:  No organomegaly noted  Hernia:  None appreciated  Skin  Inspection:  Grossly normal   Breasts: Examined lying and sitting.     Right: Without masses, retractions, discharge or axillary adenopathy.     Left: Without masses, retractions, discharge or axillary adenopathy. Gentitourinary   Inguinal/mons:  Normal without inguinal adenopathy  External genitalia:  Normal  BUS/Urethra/Skene's glands:  Normal  Vagina:  Normal  Cervix:  Normal  Uterus:   normal in size, shape and contour.  Midline and mobile  Adnexa/parametria:     Rt: Without masses or tenderness.   Lt: Without masses or tenderness.  Anus and perineum: Normal  Digital rectal exam: Normal sphincter tone without palpated masses or tenderness  Assessment/Plan:  48 y.o. MWF G1P1 for annual exam.   Pain menopausal symptoms Hypothyroid-Labs and meds  per primary care  Plan: SBE's, continue annual 3D mammogram,  history of dense breast. Continue regular exercise, calcium rich diet, vitamin D 2000 daily encouraged. Menopause reviewed, instructed to call if cycles space greater than 3 months. Pap, Pap normal 2012, new screening guidelines reviewed.  [place], 11:53 AM 11/14/2013

## 2014-06-03 ENCOUNTER — Encounter: Payer: Self-pay | Admitting: Women's Health

## 2014-06-04 ENCOUNTER — Other Ambulatory Visit: Payer: Self-pay

## 2014-06-04 DIAGNOSIS — Z1231 Encounter for screening mammogram for malignant neoplasm of breast: Secondary | ICD-10-CM

## 2014-07-03 ENCOUNTER — Ambulatory Visit: Payer: Managed Care, Other (non HMO)

## 2014-08-06 ENCOUNTER — Ambulatory Visit
Admission: RE | Admit: 2014-08-06 | Discharge: 2014-08-06 | Disposition: A | Discharge status: Home / Self Care | Payer: Managed Care, Other (non HMO) | Source: Ambulatory Visit

## 2014-08-06 DIAGNOSIS — Z1231 Encounter for screening mammogram for malignant neoplasm of breast: Secondary | ICD-10-CM

## 2014-08-08 ENCOUNTER — Other Ambulatory Visit: Payer: Self-pay | Admitting: Women's Health

## 2014-08-08 DIAGNOSIS — R928 Other abnormal and inconclusive findings on diagnostic imaging of breast: Secondary | ICD-10-CM

## 2014-08-16 ENCOUNTER — Other Ambulatory Visit: Payer: Managed Care, Other (non HMO)

## 2014-08-16 ENCOUNTER — Ambulatory Visit
Admission: RE | Admit: 2014-08-16 | Discharge: 2014-08-16 | Disposition: A | Discharge status: Home / Self Care | Payer: Managed Care, Other (non HMO) | Source: Ambulatory Visit | Attending: Women's Health | Admitting: Women's Health

## 2014-08-16 DIAGNOSIS — R928 Other abnormal and inconclusive findings on diagnostic imaging of breast: Secondary | ICD-10-CM

## 2014-12-25 ENCOUNTER — Encounter: Payer: Managed Care, Other (non HMO) | Admitting: Women's Health

## 2015-05-02 ENCOUNTER — Encounter: Payer: Self-pay | Admitting: Women's Health

## 2015-05-02 ENCOUNTER — Ambulatory Visit (INDEPENDENT_AMBULATORY_CARE_PROVIDER_SITE_OTHER): Payer: Managed Care, Other (non HMO) | Admitting: Women's Health

## 2015-05-02 VITALS — BP 110/78 | Ht 61.0 in | Wt 123.0 lb

## 2015-05-02 DIAGNOSIS — Z01419 Encounter for gynecological examination (general) (routine) without abnormal findings: Secondary | ICD-10-CM | POA: Diagnosis not present

## 2015-05-02 NOTE — Progress Notes (Signed)
Patient ID: Hayley Stewart, female   DOB: 1965-11-19, 49 y.o.   MRN: [id number] Hayley Stewart 1966-04-27 [id number]    History:    Presents for annual exam with no concerns or complaints.  Perimenopausal with irregular periods,  every 1-3 months, has noticed dryness causing painful intercourse,  vasectomy.  Denies other menopause symptoms .   Normal pap history.  Mammogram 1/15 w/ follow up ultrasound showed a benign cyst in the left breast, all previous work-ups have been benign.  Has a healthy diet and exercises regularly.  Past medical history, past surgical history, family history and social history were all reviewed and documented in the EPIC chart.  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  Benign adenoma right thyroid nodule, hypothyroidism, continues followup with endocrinologist. Treated for osteopenia with Boniva and Fosamax per primary care 2004-2006 for a Z score of -1.3. Maternal grandmother: breast cancer when she was in her 65's. Maternal uncle colon cancer in his 31s.  ROS:  A ROS was performed and pertinent positives and negatives are included.  Exam:  Filed Vitals:   05/02/15 0954  BP: 110/78    General appearance:  Normal Thyroid:  Symmetrical, normal in size, without palpable masses or nodularity. Respiratory  Auscultation:  Clear without wheezing or rhonchi Cardiovascular  Auscultation:  Regular rate, without rubs, murmurs or gallops  Edema/varicosities:  Not grossly evident Abdominal  Soft,nontender, without masses, guarding or rebound.  Liver/spleen:  No organomegaly noted  Hernia:  None appreciated  Skin  Inspection:  Grossly normal   Breasts: Examined lying and sitting.     Right: Without masses, retractions, discharge or axillary adenopathy.     Left: Without masses, retractions, discharge or axillary adenopathy. Gentitourinary   Inguinal/mons:  Normal without inguinal adenopathy  External genitalia:   Normal  BUS/Urethra/Skene's glands:  Normal  Vagina:  Normal  Cervix:  Normal  Uterus:  normal in size, shape and contour.  Midline and mobile  Adnexa/parametria:     Rt: Without masses or tenderness.   Lt: Without masses or tenderness.  Anus and perineum: Normal  Digital rectal exam: Normal sphincter tone without palpated masses or tenderness  Assessment/Plan:  49 y.o.  MWF G1P1 for annual exam.   Premenopausal/vasectomy Dyspareunia Hypothyroidism- Synthroid per endocrinologist  Plan: SBE's, continue annual 3D mammogram due to h/o dense breast tissue.  Continue healthy lifestyle, regular exercise, calcium rich diet, vitamin D 2000 daily encouraged. Menopause reviewed, encouraged use of lubricant.   UA. Has screening labs through husband's work will fax results, normal in the past. Pap normal 2015, new screening guidelines reviewed.    Hayley Stewart [facility], 11:36 AM 05/02/2015

## 2015-05-02 NOTE — Patient Instructions (Signed)

## 2015-05-03 LAB — URINALYSIS W MICROSCOPIC + REFLEX CULTURE
Bacteria, UA: NONE SEEN [HPF]
Bilirubin Urine: NEGATIVE
Casts: NONE SEEN [LPF]
Glucose, UA: NEGATIVE
Hgb urine dipstick: NEGATIVE
Ketones, ur: NEGATIVE
Leukocytes, UA: NEGATIVE
Nitrite: NEGATIVE
Protein, ur: NEGATIVE
RBC / HPF: NONE SEEN RBC/HPF (ref <=2)
Specific Gravity, Urine: 1.027 (ref 1.001–1.035)
WBC, UA: NONE SEEN WBC/HPF (ref <=5)
Yeast: NONE SEEN [HPF]
pH: 5 (ref 5.0–8.0)

## 2015-07-17 ENCOUNTER — Other Ambulatory Visit: Payer: Self-pay

## 2015-07-17 DIAGNOSIS — Z1231 Encounter for screening mammogram for malignant neoplasm of breast: Secondary | ICD-10-CM

## 2015-08-20 ENCOUNTER — Ambulatory Visit
Admission: RE | Admit: 2015-08-20 | Discharge: 2015-08-20 | Disposition: A | Discharge status: Home / Self Care | Payer: 59 | Source: Ambulatory Visit

## 2015-08-20 DIAGNOSIS — Z1231 Encounter for screening mammogram for malignant neoplasm of breast: Secondary | ICD-10-CM

## 2015-10-27 ENCOUNTER — Ambulatory Visit (INDEPENDENT_AMBULATORY_CARE_PROVIDER_SITE_OTHER): Payer: Managed Care, Other (non HMO) | Admitting: Women's Health

## 2015-10-27 ENCOUNTER — Encounter: Payer: Self-pay | Admitting: Women's Health

## 2015-10-27 VITALS — BP 126/80 | Ht 61.0 in | Wt 123.0 lb

## 2015-10-27 DIAGNOSIS — N912 Amenorrhea, unspecified: Secondary | ICD-10-CM | POA: Diagnosis not present

## 2015-10-27 DIAGNOSIS — N951 Menopausal and female climacteric states: Secondary | ICD-10-CM

## 2015-10-27 NOTE — Patient Instructions (Signed)
Hormone Therapy At menopause, your body begins making less estrogen and progesterone hormones. This causes the body to stop having menstrual periods. This is because estrogen and progesterone hormones control your periods and menstrual cycle. A lack of estrogen may cause symptoms such as:  Hot flushes (or hot flashes).  Vaginal dryness.  Dry skin.  Loss of sex drive.  Risk of bone loss (osteoporosis). When this happens, you may choose to take hormone therapy to get back the estrogen lost during menopause. When the hormone estrogen is given alone, it is usually referred to as ET (Estrogen Therapy). When the hormone progestin is combined with estrogen, it is generally called HT (Hormone Therapy). This was formerly known as hormone replacement therapy (HRT). Your caregiver can help you make a decision on what will be best for you. The decision to use HT seems to change often as new studies are done. Many studies do not agree on the benefits of hormone replacement therapy. LIKELY BENEFITS OF HT INCLUDE PROTECTION FROM:  Hot Flushes (also called hot flashes) - A hot flush is a sudden feeling of heat that spreads over the face and body. The skin may redden like a blush. It is connected with sweats and sleep disturbance. Women going through menopause may have hot flushes a few times a month or several times per day depending on the woman.  Osteoporosis (bone loss) - Estrogen helps guard against bone loss. After menopause, a woman's bones slowly lose calcium and become weak and brittle. As a result, bones are more likely to break. The hip, wrist, and spine are affected most often. Hormone therapy can help slow bone loss after menopause. Weight bearing exercise and taking calcium with vitamin D also can help prevent bone loss. There are also medications that your caregiver can prescribe that can help prevent osteoporosis.  Vaginal dryness - Loss of estrogen causes changes in the vagina. Its lining may  become thin and dry. These changes can cause pain and bleeding during sexual intercourse. Dryness can also lead to infections. This can cause burning and itching. (Vaginal estrogen treatment can help relieve pain, itching, and dryness.)  Urinary tract infections are more common after menopause because of lack of estrogen. Some women also develop urinary incontinence because of low estrogen levels in the vagina and bladder.  Possible other benefits of estrogen include a positive effect on mood and short-term memory in women. RISKS AND COMPLICATIONS  Using estrogen alone without progesterone causes the lining of the uterus to grow. This increases the risk of lining of the uterus (endometrial) cancer. Your caregiver should give another hormone called progestin if you have a uterus.  Women who take combined (estrogen and progestin) HT appear to have an increased risk of breast cancer. The risk appears to be small, but increases throughout the time that HT is taken.  Combined therapy also makes the breast tissue slightly denser which makes it harder to read mammograms (breast X-rays).  Combined, estrogen and progesterone therapy can be taken together every day, in which case there may be spotting of blood. HT therapy can be taken cyclically in which case you will have menstrual periods. Cyclically means HT is taken for a set amount of days, then not taken, then this process is repeated.  HT may increase the risk of stroke, heart attack, breast cancer and forming blood clots in your leg.  Transdermal estrogen (estrogen that is absorbed through the skin with a patch or a cream) may have better results with:  Cholesterol.  Blood pressure.  Blood clots. Having the following conditions may indicate you should not have HT:  Endometrial cancer.  Liver disease.  Breast cancer.  Heart disease.  History of blood clots.  Stroke. TREATMENT   If you choose to take HT and have a uterus, usually  estrogen and progestin are prescribed.  Your caregiver will help you decide the best way to take the medications.  Possible ways to take estrogen include:  Pills.  Patches.  Gels.  Sprays.  Vaginal estrogen cream, rings and tablets.  It is best to take the lowest dose possible that will help your symptoms and take them for the shortest period of time that you can.  Hormone therapy can help relieve some of the problems (symptoms) that affect women at menopause. Before making a decision about HT, talk to your caregiver about what is best for you. Be well informed and comfortable with your decisions. HOME CARE INSTRUCTIONS   Follow your caregivers advice when taking the medications.  A Pap test is done to screen for cervical cancer.  The first Pap test should be done at age 34.  Between ages 80 and 52, Pap tests are repeated every 2 years.  Beginning at age 13, you are advised to have a Pap test every 3 years as long as the past 3 Pap tests have been normal.  Some women have medical problems that increase the chance of getting cervical cancer. Talk to your caregiver about these problems. It is especially important to talk to your caregiver if a new problem develops soon after your last Pap test. In these cases, your caregiver may recommend more frequent screening and Pap tests.  The above recommendations are the same for women who have or have not gotten the vaccine for HPV (human papillomavirus).  If you had a hysterectomy for a problem that was not a cancer or a condition that could lead to cancer, then you no longer need Pap tests. However, even if you no longer need a Pap test, a regular exam is a good idea to make sure no other problems are starting.  If you are between ages 20 and 60, and you have had normal Pap tests going back 10 years, you no longer need Pap tests. However, even if you no longer need a Pap test, a regular exam is a good idea to make sure no other problems  are starting.  If you have had past treatment for cervical cancer or a condition that could lead to cancer, you need Pap tests and screening for cancer for at least 20 years after your treatment.  If Pap tests have been discontinued, risk factors (such as a new sexual partner)need to be re-assessed to determine if screening should be resumed.  Some women may need screenings more often if they are at high risk for cervical cancer.  Get mammograms done as per the advice of your caregiver. SEEK IMMEDIATE MEDICAL CARE IF:  You develop abnormal vaginal bleeding.  You have pain or swelling in your legs, shortness of breath, or chest pain.  You develop dizziness or headaches.  You have lumps or changes in your breasts or armpits.  You have slurred speech.  You develop weakness or numbness of your arms or legs.  You have pain, burning, or bleeding when urinating.  You develop abdominal pain.   This information is not intended to replace advice given to you by your health care provider. Make sure you discuss any questions  you have with your health care provider.   Document Released: 04/17/2003 Document Revised: 12/03/2014 Document Reviewed: 01/20/2015 Elsevier Interactive Patient Education 2016 [employer] Menopause Menopause is the normal time of life when menstrual periods stop completely. Menopause is complete when you have missed 12 consecutive menstrual periods. It usually occurs between the ages of 48 years and 55 years. Very rarely does a woman develop menopause before the age of 40 years. At menopause, your ovaries stop producing the female hormones estrogen and progesterone. This can cause undesirable symptoms and also affect your health. Sometimes the symptoms may occur 4-5 years before the menopause begins. There is no relationship between menopause and:  Oral contraceptives.  Number of children you had.  Race.  The age your menstrual periods started (menarche). Heavy  smokers and very thin women may develop menopause earlier in life. CAUSES  The ovaries stop producing the female hormones estrogen and progesterone.  Other causes include:  Surgery to remove both ovaries.  The ovaries stop functioning for no known reason.  Tumors of the pituitary gland in the brain.  Medical disease that affects the ovaries and hormone production.  Radiation treatment to the abdomen or pelvis.  Chemotherapy that affects the ovaries. SYMPTOMS   Hot flashes.  Night sweats.  Decrease in sex drive.  Vaginal dryness and thinning of the vagina causing painful intercourse.  Dryness of the skin and developing wrinkles.  Headaches.  Tiredness.  Irritability.  Memory problems.  Weight gain.  Bladder infections.  Hair growth of the face and chest.  Infertility. More serious symptoms include:  Loss of bone (osteoporosis) causing breaks (fractures).  Depression.  Hardening and narrowing of the arteries (atherosclerosis) causing heart attacks and strokes. DIAGNOSIS   When the menstrual periods have stopped for 12 straight months.  Physical exam.  Hormone studies of the blood. TREATMENT  There are many treatment choices and nearly as many questions about them. The decisions to treat or not to treat menopausal changes is an individual choice made with your health care provider. Your health care provider can discuss the treatments with you. Together, you can decide which treatment will work best for you. Your treatment choices may include:   Hormone therapy (estrogen and progesterone).  Non-hormonal medicines.  Treating the individual symptoms with medicine (for example antidepressants for depression).  Herbal medicines that may help specific symptoms.  Counseling by a psychiatrist or psychologist.  Group therapy.  Lifestyle changes including:  Eating healthy.  Regular exercise.  Limiting caffeine and alcohol.  Stress management and  meditation.  No treatment. HOME CARE INSTRUCTIONS   Take the medicine your health care provider gives you as directed.  Get plenty of sleep and rest.  Exercise regularly.  Eat a diet that contains calcium (good for the bones) and soy products (acts like estrogen hormone).  Avoid alcoholic beverages.  Do not smoke.  If you have hot flashes, dress in layers.  Take supplements, calcium, and vitamin D to strengthen bones.  You can use over-the-counter lubricants or moisturizers for vaginal dryness.  Group therapy is sometimes very helpful.  Acupuncture may be helpful in some cases. SEEK MEDICAL CARE IF:   You are not sure you are in menopause.  You are having menopausal symptoms and need advice and treatment.  You are still having menstrual periods after age 55 years.  You have pain with intercourse.  Menopause is complete (no menstrual period for 12 months) and you develop vaginal bleeding.  You need a referral to   a specialist (gynecologist, psychiatrist, or psychologist) for treatment. SEEK IMMEDIATE MEDICAL CARE IF:   You have severe depression.  You have excessive vaginal bleeding.  You fell and think you have a broken bone.  You have pain when you urinate.  You develop leg or chest pain.  You have a fast pounding heart beat (palpitations).  You have severe headaches.  You develop vision problems.  You feel a lump in your breast.  You have abdominal pain or severe indigestion.   This information is not intended to replace advice given to you by your health care provider. Make sure you discuss any questions you have with your health care provider.   Document Released: 10/09/2003 Document Revised: 03/21/2013 Document Reviewed: 02/15/2013 Elsevier Interactive Patient Education 2016 Elsevier Inc.  

## 2015-10-27 NOTE — Progress Notes (Signed)
Patient ID: Hayley Stewart, female   DOB: 1965/11/06, 50 y.o.   MRN: [id number] Presents with complaint of increased moodiness, short tempered, vaginal dryness, hot flushes and generally not feeling herself. Cycles every 1-2 months for the past year, last cycle January 10 and has had increased hot flushes lately. Reports good relationships with daughter and husband. States no change or stress. Husband vasectomy. Hypothyroid but states symptoms do not feel related.  Exam: Appears well.  Perimenopausal  Plan: [place]. Reviewed HRT or low-dose OCs may help with symptom relief. Long discussion concerning menopause and the hormonal changes that may be causing some of her concerns.

## 2015-10-29 ENCOUNTER — Other Ambulatory Visit: Payer: Self-pay | Admitting: Women's Health

## 2015-10-29 DIAGNOSIS — Z79890 Hormone replacement therapy (postmenopausal): Secondary | ICD-10-CM

## 2015-10-29 LAB — FOLLICLE STIMULATING HORMONE: FSH: 184.3 m[IU]/mL — ABNORMAL HIGH

## 2015-10-29 MED ORDER — PROGESTERONE MICRONIZED 200 MG PO CAPS: ORAL_CAPSULE | ORAL | Status: DC

## 2015-10-29 MED ORDER — ESTRADIOL 0.05 MG/24HR TD PTTW: 1.0000 | MEDICATED_PATCH | TRANSDERMAL | Status: DC

## 2016-01-30 ENCOUNTER — Telehealth: Payer: Self-pay

## 2016-01-30 NOTE — Telephone Encounter (Signed)
Patient wants to know if she could skip taking her Progesterone day 1-12 of the mos of July so that she does not have to have "menstrual cycle" while she is on vacation.

## 2016-01-30 NOTE — Telephone Encounter (Signed)
Okay to stop for one month be sure to  take in August

## 2016-01-30 NOTE — Telephone Encounter (Signed)
Left detailed message in voice mail per [facility] access note.

## 2016-02-16 ENCOUNTER — Telehealth: Payer: Self-pay | Admitting: *Deleted

## 2016-02-16 NOTE — Telephone Encounter (Signed)
Ok, lets try estradiol .5 po daily and continue the prometrium 200 mg hs day 1-12 of each month, is she having irreg bleeding?  Let me know if she is

## 2016-02-16 NOTE — Telephone Encounter (Signed)
Pt called c/o her estradiol patches are causes skin irritation/skin welts even with rotating patches. Pt asked if she could try another option. Please advise

## 2016-02-17 MED ORDER — ESTRADIOL 0.5 MG PO TABS: 0.5000 mg | ORAL_TABLET | Freq: Every day | ORAL | Status: DC

## 2016-02-17 NOTE — Telephone Encounter (Signed)
Pt informed with the below note, Rx sent. Pt is not having any bleeding.

## 2016-02-17 NOTE — Telephone Encounter (Signed)
Left message for pt to call.

## 2016-05-12 ENCOUNTER — Encounter: Payer: Managed Care, Other (non HMO) | Admitting: Women's Health

## 2016-05-20 ENCOUNTER — Ambulatory Visit (INDEPENDENT_AMBULATORY_CARE_PROVIDER_SITE_OTHER): Payer: Managed Care, Other (non HMO) | Admitting: Women's Health

## 2016-05-20 ENCOUNTER — Encounter: Payer: Self-pay | Admitting: Women's Health

## 2016-05-20 VITALS — BP 110/78 | Ht 61.0 in | Wt 127.8 lb

## 2016-05-20 DIAGNOSIS — N952 Postmenopausal atrophic vaginitis: Secondary | ICD-10-CM | POA: Diagnosis not present

## 2016-05-20 DIAGNOSIS — Z01419 Encounter for gynecological examination (general) (routine) without abnormal findings: Secondary | ICD-10-CM

## 2016-05-20 MED ORDER — ESTRADIOL 2 MG VA RING: 2.0000 mg | VAGINAL_RING | VAGINAL | 4 refills | Status: DC

## 2016-05-20 NOTE — Patient Instructions (Addendum)
[facility]  Dr Carlean Purl [phone]  Menopause is a normal process in which your reproductive ability comes to an end. This process happens gradually over a span of months to years, usually between the ages of 42 and 74. Menopause is complete when you have missed 12 consecutive menstrual periods. It is important to talk with your health care provider about some of the most common conditions that affect postmenopausal women, such as heart disease, cancer, and bone loss (osteoporosis). Adopting a healthy lifestyle and getting preventive care can help to promote your health and wellness. Those actions can also lower your chances of developing some of these common conditions. WHAT SHOULD I KNOW ABOUT MENOPAUSE? During menopause, you may experience a number of symptoms, such as:  Moderate-to-severe hot flashes.  Night sweats.  Decrease in sex drive.  Mood swings.  Headaches.  Tiredness.  Irritability.  Memory problems.  Insomnia. Choosing to treat or not to treat menopausal changes is an individual decision that you make with your health care provider. WHAT SHOULD I KNOW ABOUT HORMONE REPLACEMENT THERAPY AND SUPPLEMENTS? Hormone therapy products are effective for treating symptoms that are associated with menopause, such as hot flashes and night sweats. Hormone replacement carries certain risks, especially as you become older. If you are thinking about using estrogen or estrogen with progestin treatments, discuss the benefits and risks with your health care provider. WHAT SHOULD I KNOW ABOUT HEART DISEASE AND STROKE? Heart disease, heart attack, and stroke become more likely as you age. This may be due, in part, to the hormonal changes that your body experiences during menopause. These can affect how your body processes dietary fats, triglycerides, and cholesterol. Heart attack and stroke are both medical emergencies. There are many things that you can do to help prevent heart disease and  stroke:  Have your blood pressure checked at least every 1-2 years. High blood pressure causes heart disease and increases the risk of stroke.  If you are 20-61 years old, ask your health care provider if you should take aspirin to prevent a heart attack or a stroke.  Do not use any tobacco products, including cigarettes, chewing tobacco, or electronic cigarettes. If you need help quitting, ask your health care provider.  It is important to eat a healthy diet and maintain a healthy weight.  Be sure to include plenty of vegetables, fruits, low-fat dairy products, and lean protein.  Avoid eating foods that are high in solid fats, added sugars, or salt (sodium).  Get regular exercise. This is one of the most important things that you can do for your health.  Try to exercise for at least 150 minutes each week. The type of exercise that you do should increase your heart rate and make you sweat. This is known as moderate-intensity exercise.  Try to do strengthening exercises at least twice each week. Do these in addition to the moderate-intensity exercise.  Know your numbers.Ask your health care provider to check your cholesterol and your blood glucose. Continue to have your blood tested as directed by your health care provider. WHAT SHOULD I KNOW ABOUT CANCER SCREENING? There are several types of cancer. Take the following steps to reduce your risk and to catch any cancer development as early as possible. Breast Cancer  Practice breast self-awareness.  This means understanding how your breasts normally appear and feel.  It also means doing regular breast self-exams. Let your health care provider know about any changes, no matter how small.  If you are 40  or older, have a clinician do a breast exam (clinical breast exam or CBE) every year. Depending on your age, family history, and medical history, it may be recommended that you also have a yearly breast X-ray (mammogram).  If you have a  family history of breast cancer, talk with your health care provider about genetic screening.  If you are at high risk for breast cancer, talk with your health care provider about having an MRI and a mammogram every year.  Breast cancer (BRCA) gene test is recommended for women who have family members with BRCA-related cancers. Results of the assessment will determine the need for genetic counseling and BRCA1 and for BRCA2 testing. BRCA-related cancers include these types:  Breast. This occurs in males or females.  Ovarian.  Tubal. This may also be called fallopian tube cancer.  Cancer of the abdominal or pelvic lining (peritoneal cancer).  Prostate.  Pancreatic. Cervical, Uterine, and Ovarian Cancer Your health care provider may recommend that you be screened regularly for cancer of the pelvic organs. These include your ovaries, uterus, and vagina. This screening involves a pelvic exam, which includes checking for microscopic changes to the surface of your cervix (Pap test).  For women ages 21-65, health care providers may recommend a pelvic exam and a Pap test every three years. For women ages 73-65, they may recommend the Pap test and pelvic exam, combined with testing for human papilloma virus (HPV), every five years. Some types of HPV increase your risk of cervical cancer. Testing for HPV may also be done on women of any age who have unclear Pap test results.  Other health care providers may not recommend any screening for nonpregnant women who are considered low risk for pelvic cancer and have no symptoms. Ask your health care provider if a screening pelvic exam is right for you.  If you have had past treatment for cervical cancer or a condition that could lead to cancer, you need Pap tests and screening for cancer for at least 20 years after your treatment. If Pap tests have been discontinued for you, your risk factors (such as having a new sexual partner) need to be reassessed to  determine if you should start having screenings again. Some women have medical problems that increase the chance of getting cervical cancer. In these cases, your health care provider may recommend that you have screening and Pap tests more often.  If you have a family history of uterine cancer or ovarian cancer, talk with your health care provider about genetic screening.  If you have vaginal bleeding after reaching menopause, tell your health care provider.  There are currently no reliable tests available to screen for ovarian cancer. Lung Cancer Lung cancer screening is recommended for adults 15-12 years old who are at high risk for lung cancer because of a history of smoking. A yearly low-dose CT scan of the lungs is recommended if you:  Currently smoke.  Have a history of at least 30 pack-years of smoking and you currently smoke or have quit within the past 15 years. A pack-year is smoking an average of one pack of cigarettes per day for one year. Yearly screening should:  Continue until it has been 15 years since you quit.  Stop if you develop a health problem that would prevent you from having lung cancer treatment. Colorectal Cancer  This type of cancer can be detected and can often be prevented.  Routine colorectal cancer screening usually begins at age 26 and continues  through age 62.  If you have risk factors for colon cancer, your health care provider may recommend that you be screened at an earlier age.  If you have a family history of colorectal cancer, talk with your health care provider about genetic screening.  Your health care provider may also recommend using home test kits to check for hidden blood in your stool.  A small camera at the end of a tube can be used to examine your colon directly (sigmoidoscopy or colonoscopy). This is done to check for the earliest forms of colorectal cancer.  Direct examination of the colon should be repeated every 5-10 years until age  49. However, if early forms of precancerous polyps or small growths are found or if you have a family history or genetic risk for colorectal cancer, you may need to be screened more often. Skin Cancer  Check your skin from head to toe regularly.  Monitor any moles. Be sure to tell your health care provider:  About any new moles or changes in moles, especially if there is a change in a mole's shape or color.  If you have a mole that is larger than the size of a pencil eraser.  If any of your family members has a history of skin cancer, especially at a Dama Hedgepeth age, talk with your health care provider about genetic screening.  Always use sunscreen. Apply sunscreen liberally and repeatedly throughout the day.  Whenever you are outside, protect yourself by wearing long sleeves, pants, a wide-brimmed hat, and sunglasses. WHAT SHOULD I KNOW ABOUT OSTEOPOROSIS? Osteoporosis is a condition in which bone destruction happens more quickly than new bone creation. After menopause, you may be at an increased risk for osteoporosis. To help prevent osteoporosis or the bone fractures that can happen because of osteoporosis, the following is recommended:  If you are 66-77 years old, get at least 1,000 mg of calcium and at least 600 mg of vitamin D per day.  If you are older than age 18 but younger than age 12, get at least 1,200 mg of calcium and at least 600 mg of vitamin D per day.  If you are older than age 78, get at least 1,200 mg of calcium and at least 800 mg of vitamin D per day. Smoking and excessive alcohol intake increase the risk of osteoporosis. Eat foods that are rich in calcium and vitamin D, and do weight-bearing exercises several times each week as directed by your health care provider. WHAT SHOULD I KNOW ABOUT HOW MENOPAUSE AFFECTS [place]? Depression may occur at any age, but it is more common as you become older. Common symptoms of depression include:  Low or sad mood.  Changes  in sleep patterns.  Changes in appetite or eating patterns.  Feeling an overall lack of motivation or enjoyment of activities that you previously enjoyed.  Frequent crying spells. Talk with your health care provider if you think that you are experiencing depression. WHAT SHOULD I KNOW ABOUT IMMUNIZATIONS? It is important that you get and maintain your immunizations. These include:  Tetanus, diphtheria, and pertussis (Tdap) booster vaccine.  Influenza every year before the flu season begins.  Pneumonia vaccine.  Shingles vaccine. Your health care provider may also recommend other immunizations.   This information is not intended to replace advice given to you by your health care provider. Make sure you discuss any questions you have with your health care provider.   Document Released: 09/10/2005 Document Revised: 08/09/2014 Document Reviewed:  03/21/2014 Elsevier Interactive Patient Education [employer].

## 2016-05-20 NOTE — Progress Notes (Signed)
Hayley Stewart 10/25/1965 [id number]    History:    Presents for annual exam.  Postmenopausal with no bleeding. Had been on HRT for a couple of months but stopped. States continues with hot flushes but less severe. 10/2015 [place] [facility]. Hypothyroid had  adenoma  right thyroid  endocrinologist manages. Normal Pap and mammogram history. Reports normal screening labs through insurance company.  Past medical history, past surgical history, family history and social history were all reviewed and documented in the EPIC chart. Homemaker. Hayley Stewart 15 doing well. Healthy lifestyle of diet and regular exercise.  ROS:  A ROS was performed and pertinent positives and negatives are included.  Exam:  Vitals:   05/20/16 1026  BP: 110/78  Weight: 127 lb 12.8 oz (58 kg)  Height: 5\' 1"  (1.549 m)   Body mass index is 24.15 kg/m.   General appearance:  Normal Thyroid:  Symmetrical, normal in size, without palpable masses or nodularity. Respiratory  Auscultation:  Clear without wheezing or rhonchi Cardiovascular  Auscultation:  Regular rate, without rubs, murmurs or gallops  Edema/varicosities:  Not grossly evident Abdominal  Soft,nontender, without masses, guarding or rebound.  Liver/spleen:  No organomegaly noted  Hernia:  None appreciated  Skin  Inspection:  Grossly normal   Breasts: Examined lying and sitting.     Right: Without masses, retractions, discharge or axillary adenopathy.     Left: Without masses, retractions, discharge or axillary adenopathy. Gentitourinary   Inguinal/mons:  Normal without inguinal adenopathy  External genitalia:  Normal  BUS/Urethra/Skene's glands:  Normal  Vagina:  Dryness/atrophic  Cervix:  Normal  Uterus:   normal in size, shape and contour.  Midline and mobile  Adnexa/parametria:     Rt: Without masses or tenderness.   Lt: Without masses or tenderness.  Anus and perineum: Normal  Digital rectal exam: Normal sphincter tone without palpated masses or  tenderness  Assessment/Plan:  50 y.o. MWF G1 P1  for annual exam with occasional hot flushes.  Postmenopausal/no HRT/no bleeding Hypothyroid-endocrinologist manages Vaginal dryness/atrophy Screening labs-insurance company  Plan: Continue vaginal lubricants, options reviewed would like to try Estring prescription, proper use given and reviewed minimal systemic absorption. SBE's, continue annual 3-D screening mammogram, calcium rich diet, vitamin D 2000 daily encouraged. Encouraged to continue healthy lifestyle of regular exercise and diet. Screening colonoscopy reviewed [facility] information given instructed to schedule. UA, Pap normal 2015, new screening guidelines reviewed.  Hayley Stewart [facility], 12:34 PM 05/20/2016

## 2016-09-01 ENCOUNTER — Telehealth: Payer: Self-pay | Admitting: *Deleted

## 2016-09-01 ENCOUNTER — Other Ambulatory Visit: Payer: Self-pay | Admitting: Women's Health

## 2016-09-01 DIAGNOSIS — Z1231 Encounter for screening mammogram for malignant neoplasm of breast: Secondary | ICD-10-CM

## 2016-09-01 NOTE — Telephone Encounter (Signed)
Pt called requesting name GI office that nancy told her at 05/20/16 visit. I called and left message on pt voicemail [facility]

## 2016-09-07 ENCOUNTER — Encounter: Payer: Self-pay | Admitting: Internal Medicine

## 2016-10-19 ENCOUNTER — Ambulatory Visit
Admission: RE | Admit: 2016-10-19 | Discharge: 2016-10-19 | Disposition: A | Discharge status: Home / Self Care | Payer: 59 | Source: Ambulatory Visit | Attending: Women's Health | Admitting: Women's Health

## 2016-10-19 DIAGNOSIS — Z1231 Encounter for screening mammogram for malignant neoplasm of breast: Secondary | ICD-10-CM

## 2016-10-20 ENCOUNTER — Other Ambulatory Visit: Payer: Self-pay | Admitting: Women's Health

## 2016-10-20 ENCOUNTER — Ambulatory Visit (AMBULATORY_SURGERY_CENTER): Payer: Self-pay

## 2016-10-20 VITALS — Ht 62.0 in | Wt 128.6 lb

## 2016-10-20 DIAGNOSIS — Z1211 Encounter for screening for malignant neoplasm of colon: Secondary | ICD-10-CM

## 2016-10-20 DIAGNOSIS — R928 Other abnormal and inconclusive findings on diagnostic imaging of breast: Secondary | ICD-10-CM

## 2016-10-20 MED ORDER — NA SULFATE-K SULFATE-MG SULF 17.5-3.13-1.6 GM/177ML PO SOLN: 1.0000 | Freq: Once | ORAL | 0 refills | Status: AC

## 2016-10-20 NOTE — Progress Notes (Signed)
Denies allergies to eggs or soy products. Denies complication of anesthesia or sedation. Denies use of weight loss medication. Denies use of O2.   Emmi instructions declined.  

## 2016-10-22 ENCOUNTER — Ambulatory Visit
Admission: RE | Admit: 2016-10-22 | Discharge: 2016-10-22 | Disposition: A | Discharge status: Home / Self Care | Payer: 59 | Source: Ambulatory Visit | Attending: Women's Health | Admitting: Women's Health

## 2016-10-22 DIAGNOSIS — R928 Other abnormal and inconclusive findings on diagnostic imaging of breast: Secondary | ICD-10-CM

## 2016-10-31 DIAGNOSIS — D126 Benign neoplasm of colon, unspecified: Secondary | ICD-10-CM

## 2016-10-31 HISTORY — DX: Benign neoplasm of colon, unspecified: D12.6

## 2016-11-01 ENCOUNTER — Ambulatory Visit (AMBULATORY_SURGERY_CENTER): Payer: Managed Care, Other (non HMO) | Admitting: Internal Medicine

## 2016-11-01 ENCOUNTER — Encounter: Payer: Self-pay | Admitting: Internal Medicine

## 2016-11-01 VITALS — BP 137/74 | HR 89 | Temp 98.9°F | Resp 24 | Ht 62.0 in | Wt 128.0 lb

## 2016-11-01 DIAGNOSIS — D123 Benign neoplasm of transverse colon: Secondary | ICD-10-CM | POA: Diagnosis not present

## 2016-11-01 DIAGNOSIS — Z1211 Encounter for screening for malignant neoplasm of colon: Secondary | ICD-10-CM

## 2016-11-01 DIAGNOSIS — Z1212 Encounter for screening for malignant neoplasm of rectum: Secondary | ICD-10-CM

## 2016-11-01 HISTORY — PX: COLONOSCOPY: SHX174

## 2016-11-01 MED ORDER — SODIUM CHLORIDE 0.9 % IV SOLN: 500.0000 mL | INTRAVENOUS | Status: DC

## 2016-11-01 NOTE — Progress Notes (Signed)
Pt's states no medical or surgical changes since previsit or office visit. 

## 2016-11-01 NOTE — Op Note (Addendum)
[facility] Patient Name: Hayley Stewart Procedure Date: 11/01/2016 10:53 AM MRN: [id number] Endoscopist: Jerene Bears , MD Age: 51 Referring MD:  Date of Birth: 26-Jul-1966 Gender: Female Account #: [id number] Procedure:                Colonoscopy Indications:              Screening for colorectal malignant neoplasm, This                            is the patient's first colonoscopy Medicines:                Monitored Anesthesia Care Procedure:                Pre-Anesthesia Assessment:                           - Prior to the procedure, a History and Physical                            was performed, and patient medications and                            allergies were reviewed. The patient's tolerance of                            previous anesthesia was also reviewed. The risks                            and benefits of the procedure and the sedation                            options and risks were discussed with the patient.                            All questions were answered, and informed consent                            was obtained. Prior Anticoagulants: The patient has                            taken no previous anticoagulant or antiplatelet                            agents. ASA Grade Assessment: II - A patient with                            mild systemic disease. After reviewing the risks                            and benefits, the patient was deemed in                            satisfactory condition to undergo the procedure.  After obtaining informed consent, the colonoscope                            was passed under direct vision. Throughout the                            procedure, the patient's blood pressure, pulse, and                            oxygen saturations were monitored continuously. The                            Colonoscope was introduced through the anus and                            advanced to the the terminal  ileum. The colonoscopy                            was performed without difficulty. The patient                            tolerated the procedure well. The quality of the                            bowel preparation was good. The terminal ileum,                            ileocecal valve, appendiceal orifice, and rectum                            were photographed. Scope In: 11:09:33 AM Scope Out: 11:25:55 AM Scope Withdrawal Time: 0 hours 12 minutes 50 seconds  Total Procedure Duration: 0 hours 16 minutes 22 seconds  Findings:                 The perianal and digital rectal examinations were                            normal.                           The terminal ileum appeared normal.                           A 5 mm polyp was found in the splenic flexure. The                            polyp was sessile and covered with mucus cap. The                            polyp was removed with a cold snare. Resection and                            retrieval were complete.  Multiple small and large-mouthed diverticula were                            found in the sigmoid colon.                           The exam was otherwise without abnormality on                            direct and retroflexion views. Complications:            No immediate complications. Estimated Blood Loss:     Estimated blood loss was minimal. Impression:               - The examined portion of the ileum was normal.                           - One 5 mm polyp at the splenic flexure, removed                            with a cold snare. Resected and retrieved.                           - Moderate diverticulosis in the sigmoid colon.                           - The examination was otherwise normal on direct                            and retroflexion views. Recommendation:           - Patient has a contact number available for                            emergencies. The signs and symptoms of  potential                            delayed complications were discussed with the                            patient. Return to normal activities tomorrow.                            Written discharge instructions were provided to the                            patient.                           - Resume previous diet.                           - Continue present medications.                           - Await pathology results.                           -  Repeat colonoscopy is recommended. The                            colonoscopy date will be determined after pathology                            results from today's exam become available for                            review. Jerene Bears, MD 11/01/2016 11:30:02 AM This report has been signed electronically.

## 2016-11-01 NOTE — Patient Instructions (Signed)
YOU HAD AN ENDOSCOPIC PROCEDURE TODAY AT THE [facility]:   Refer to the procedure report that was given to you for any specific questions about what was found during the examination.  If the procedure report does not answer your questions, please call your gastroenterologist to clarify.  If you requested that your care partner not be given the details of your procedure findings, then the procedure report has been included in a sealed envelope for you to review at your convenience later.  YOU SHOULD EXPECT: Some feelings of bloating in the abdomen. Passage of more gas than usual.  Walking can help get rid of the air that was put into your GI tract during the procedure and reduce the bloating. If you had a lower endoscopy (such as a colonoscopy or flexible sigmoidoscopy) you may notice spotting of blood in your stool or on the toilet paper. If you underwent a bowel prep for your procedure, you may not have a normal bowel movement for a few days.  Please Note:  You might notice some irritation and congestion in your nose or some drainage.  This is from the oxygen used during your procedure.  There is no need for concern and it should clear up in a day or so.  SYMPTOMS TO REPORT IMMEDIATELY:   Following lower endoscopy (colonoscopy or flexible sigmoidoscopy):  Excessive amounts of blood in the stool  Significant tenderness or worsening of abdominal pains  Swelling of the abdomen that is new, acute  Fever of 100F or higher  For urgent or emergent issues, a gastroenterologist can be reached at any hour by calling [phone].   DIET:  We do recommend a small meal at first, but then you may proceed to your regular diet.  Drink plenty of fluids but you should avoid alcoholic beverages for 24 hours.  MEDICATIONS: Continue present medications.  Please see handouts given to you by your recovery nurse.  ACTIVITY:  You should plan to take it easy for the rest of today and you should NOT  DRIVE or use heavy machinery until tomorrow (because of the sedation medicines used during the test).    FOLLOW UP: Our staff will call the number listed on your records the next business day following your procedure to check on you and address any questions or concerns that you may have regarding the information given to you following your procedure. If we do not reach you, we will leave a message.  However, if you are feeling well and you are not experiencing any problems, there is no need to return our call.  We will assume that you have returned to your regular daily activities without incident.  If any biopsies were taken you will be contacted by phone or by letter within the next 1-3 weeks.  Please call us at [phone] if you have not heard about the biopsies in 3 weeks.   Thank you for allowing us to provide for your healthcare needs today.   SIGNATURES/CONFIDENTIALITY: You and/or your care partner have signed paperwork which will be entered into your electronic medical record.  These signatures attest to the fact that that the information above on your After Visit Summary has been reviewed and is understood.  Full responsibility of the confidentiality of this discharge information lies with you and/or your care-partner. 

## 2016-11-01 NOTE — Progress Notes (Signed)
To recovery, report to Hines, RN, VSS 

## 2016-11-01 NOTE — Progress Notes (Signed)
Called to room to assist during endoscopic procedure.  Patient ID and intended procedure confirmed with present staff. Received instructions for my participation in the procedure from the performing physician.  

## 2016-11-02 ENCOUNTER — Telehealth: Payer: Self-pay

## 2016-11-02 ENCOUNTER — Telehealth: Payer: Self-pay | Admitting: *Deleted

## 2016-11-02 NOTE — Telephone Encounter (Signed)
  Follow up Call-  Call back number 11/01/2016  Post procedure Call Back phone  # [phone]  Permission to leave phone message Yes  Some recent data might be hidden     Left message

## 2016-11-02 NOTE — Telephone Encounter (Signed)
  Follow up Call-  Call back number 11/01/2016  Post procedure Call Back phone  # [phone]  Permission to leave phone message Yes  Some recent data might be hidden     Patient questions:  Do you have a fever, pain , or abdominal swelling? No. Pain Score  0 *  Have you tolerated food without any problems? Yes.    Have you been able to return to your normal activities? Yes.    Do you have any questions about your discharge instructions: Diet   No. Medications  No. Follow up visit  No.  Do you have questions or concerns about your Care? No.  Actions: * If pain score is 4 or above: No action needed, pain <4.

## 2016-11-10 ENCOUNTER — Encounter: Payer: Self-pay | Admitting: Internal Medicine

## 2016-11-12 ENCOUNTER — Encounter: Payer: Self-pay | Admitting: Family Medicine

## 2016-11-12 DIAGNOSIS — D126 Benign neoplasm of colon, unspecified: Secondary | ICD-10-CM | POA: Insufficient documentation

## 2016-12-15 ENCOUNTER — Encounter: Payer: Self-pay | Admitting: Gynecology

## 2017-03-29 ENCOUNTER — Encounter: Payer: Self-pay | Admitting: Women's Health

## 2017-03-29 ENCOUNTER — Ambulatory Visit (INDEPENDENT_AMBULATORY_CARE_PROVIDER_SITE_OTHER): Payer: 59 | Admitting: Women's Health

## 2017-03-29 VITALS — BP 110/78 | Ht 62.0 in | Wt 128.0 lb

## 2017-03-29 DIAGNOSIS — Z79890 Hormone replacement therapy (postmenopausal): Secondary | ICD-10-CM | POA: Diagnosis not present

## 2017-03-29 MED ORDER — PROGESTERONE MICRONIZED 200 MG PO CAPS: 200.0000 mg | ORAL_CAPSULE | Freq: Every day | ORAL | 4 refills | Status: DC

## 2017-03-29 MED ORDER — ESTRADIOL 0.5 MG PO TABS: 0.5000 mg | ORAL_TABLET | Freq: Every day | ORAL | 4 refills | Status: DC

## 2017-03-29 NOTE — Patient Instructions (Signed)

## 2017-03-29 NOTE — Progress Notes (Signed)
Presents with complaint of numerous menopausal symptoms of hot flushes, poor sleep and embarrassing sweating. Postmenopausal, elevated FSH, had tried HRT 09/2016 with good relief of symptoms but stopped due to worry over risks of HRT. Had skin sensitivity with the estradiol patch. Denies abdominal pain, discharge, urinary symptoms or other problems.  Exam: Appears well.  Postmenopausal with frequent hot flushes  Plan: Options reviewed, estradiol 0.05 tablet daily, Prometrium 200 day one through 12 of each month, instructed to take at at bedtime. Reviewed slight risks of blood clots, strokes and breast cancer. Women's health initiative reviewed, to take for shortest amount of time. Encouraged to stay on a greater than 1 month but less than 7 years. Instructed to call if symptoms do not improve. Will follow-up for annual exam in October and will evaluate symptoms.

## 2017-04-06 ENCOUNTER — Telehealth: Payer: Self-pay | Admitting: *Deleted

## 2017-04-06 DIAGNOSIS — Z79890 Hormone replacement therapy (postmenopausal): Secondary | ICD-10-CM

## 2017-04-06 NOTE — Telephone Encounter (Signed)
Okay, estradiol 0.5 daily and Prometrium 100 mg at bedtime daily.

## 2017-04-06 NOTE — Telephone Encounter (Signed)
Pt was prescribed estradiol 0.5 mg and progesterone 200 mg day 1-12 monthly on 03/29/17. Pt said she has been doing some research and would prefer to take combination HRT so she will not have a cycle? Please advise

## 2017-04-07 MED ORDER — ESTRADIOL 0.5 MG PO TABS: 0.5000 mg | ORAL_TABLET | Freq: Every day | ORAL | 4 refills | Status: DC

## 2017-04-07 MED ORDER — PROGESTERONE MICRONIZED 100 MG PO CAPS: 100.0000 mg | ORAL_CAPSULE | Freq: Every day | ORAL | 3 refills | Status: DC

## 2017-04-07 NOTE — Telephone Encounter (Signed)
Left message for pt to call.

## 2017-04-07 NOTE — Telephone Encounter (Signed)
Pt aware, Rx sent. 

## 2017-05-24 ENCOUNTER — Encounter: Payer: Self-pay | Admitting: Women's Health

## 2017-05-24 ENCOUNTER — Ambulatory Visit (INDEPENDENT_AMBULATORY_CARE_PROVIDER_SITE_OTHER): Payer: 59 | Admitting: Women's Health

## 2017-05-24 VITALS — BP 120/82 | Ht 62.0 in | Wt 136.0 lb

## 2017-05-24 DIAGNOSIS — Z79890 Hormone replacement therapy (postmenopausal): Secondary | ICD-10-CM | POA: Diagnosis not present

## 2017-05-24 DIAGNOSIS — Z01419 Encounter for gynecological examination (general) (routine) without abnormal findings: Secondary | ICD-10-CM

## 2017-05-24 MED ORDER — ESTRADIOL 0.5 MG PO TABS: 0.5000 mg | ORAL_TABLET | Freq: Every day | ORAL | 4 refills | Status: DC

## 2017-05-24 MED ORDER — PROGESTERONE MICRONIZED 100 MG PO CAPS: 100.0000 mg | ORAL_CAPSULE | Freq: Every day | ORAL | 3 refills | Status: DC

## 2017-05-24 NOTE — Patient Instructions (Signed)
Health Maintenance for Postmenopausal Women Menopause is a normal process in which your reproductive ability comes to an end. This process happens gradually over a span of months to years, usually between the ages of 22 and 9. Menopause is complete when you have missed 12 consecutive menstrual periods. It is important to talk with your health care provider about some of the most common conditions that affect postmenopausal women, such as heart disease, cancer, and bone loss (osteoporosis). Adopting a healthy lifestyle and getting preventive care can help to promote your health and wellness. Those actions can also lower your chances of developing some of these common conditions. What should I know about menopause? During menopause, you may experience a number of symptoms, such as:  Moderate-to-severe hot flashes.  Night sweats.  Decrease in sex drive.  Mood swings.  Headaches.  Tiredness.  Irritability.  Memory problems.  Insomnia.  Choosing to treat or not to treat menopausal changes is an individual decision that you make with your health care provider. What should I know about hormone replacement therapy and supplements? Hormone therapy products are effective for treating symptoms that are associated with menopause, such as hot flashes and night sweats. Hormone replacement carries certain risks, especially as you become older. If you are thinking about using estrogen or estrogen with progestin treatments, discuss the benefits and risks with your health care provider. What should I know about heart disease and stroke? Heart disease, heart attack, and stroke become more likely as you age. This may be due, in part, to the hormonal changes that your body experiences during menopause. These can affect how your body processes dietary fats, triglycerides, and cholesterol. Heart attack and stroke are both medical emergencies. There are many things that you can do to help prevent heart disease  and stroke:  Have your blood pressure checked at least every 1-2 years. High blood pressure causes heart disease and increases the risk of stroke.  If you are 53-22 years old, ask your health care provider if you should take aspirin to prevent a heart attack or a stroke.  Do not use any tobacco products, including cigarettes, chewing tobacco, or electronic cigarettes. If you need help quitting, ask your health care provider.  It is important to eat a healthy diet and maintain a healthy weight. ? Be sure to include plenty of vegetables, fruits, low-fat dairy products, and lean protein. ? Avoid eating foods that are high in solid fats, added sugars, or salt (sodium).  Get regular exercise. This is one of the most important things that you can do for your health. ? Try to exercise for at least 150 minutes each week. The type of exercise that you do should increase your heart rate and make you sweat. This is known as moderate-intensity exercise. ? Try to do strengthening exercises at least twice each week. Do these in addition to the moderate-intensity exercise.  Know your numbers.Ask your health care provider to check your cholesterol and your blood glucose. Continue to have your blood tested as directed by your health care provider.  What should I know about cancer screening? There are several types of cancer. Take the following steps to reduce your risk and to catch any cancer development as early as possible. Breast Cancer  Practice breast self-awareness. ? This means understanding how your breasts normally appear and feel. ? It also means doing regular breast self-exams. Let your health care provider know about any changes, no matter how small.  If you are 40  or older, have a clinician do a breast exam (clinical breast exam or CBE) every year. Depending on your age, family history, and medical history, it may be recommended that you also have a yearly breast X-ray (mammogram).  If you  have a family history of breast cancer, talk with your health care provider about genetic screening.  If you are at high risk for breast cancer, talk with your health care provider about having an MRI and a mammogram every year.  Breast cancer (BRCA) gene test is recommended for women who have family members with BRCA-related cancers. Results of the assessment will determine the need for genetic counseling and BRCA1 and for BRCA2 testing. BRCA-related cancers include these types: ? Breast. This occurs in males or females. ? Ovarian. ? Tubal. This may also be called fallopian tube cancer. ? Cancer of the abdominal or pelvic lining (peritoneal cancer). ? Prostate. ? Pancreatic.  Cervical, Uterine, and Ovarian Cancer Your health care provider may recommend that you be screened regularly for cancer of the pelvic organs. These include your ovaries, uterus, and vagina. This screening involves a pelvic exam, which includes checking for microscopic changes to the surface of your cervix (Pap test).  For women ages 21-65, health care providers may recommend a pelvic exam and a Pap test every three years. For women ages 79-65, they may recommend the Pap test and pelvic exam, combined with testing for human papilloma virus (HPV), every five years. Some types of HPV increase your risk of cervical cancer. Testing for HPV may also be done on women of any age who have unclear Pap test results.  Other health care providers may not recommend any screening for nonpregnant women who are considered low risk for pelvic cancer and have no symptoms. Ask your health care provider if a screening pelvic exam is right for you.  If you have had past treatment for cervical cancer or a condition that could lead to cancer, you need Pap tests and screening for cancer for at least 20 years after your treatment. If Pap tests have been discontinued for you, your risk factors (such as having a new sexual partner) need to be  reassessed to determine if you should start having screenings again. Some women have medical problems that increase the chance of getting cervical cancer. In these cases, your health care provider may recommend that you have screening and Pap tests more often.  If you have a family history of uterine cancer or ovarian cancer, talk with your health care provider about genetic screening.  If you have vaginal bleeding after reaching menopause, tell your health care provider.  There are currently no reliable tests available to screen for ovarian cancer.  Lung Cancer Lung cancer screening is recommended for adults 69-62 years old who are at high risk for lung cancer because of a history of smoking. A yearly low-dose CT scan of the lungs is recommended if you:  Currently smoke.  Have a history of at least 30 pack-years of smoking and you currently smoke or have quit within the past 15 years. A pack-year is smoking an average of one pack of cigarettes per day for one year.  Yearly screening should:  Continue until it has been 15 years since you quit.  Stop if you develop a health problem that would prevent you from having lung cancer treatment.  Colorectal Cancer  This type of cancer can be detected and can often be prevented.  Routine colorectal cancer screening usually begins at  age 42 and continues through age 45.  If you have risk factors for colon cancer, your health care provider may recommend that you be screened at an earlier age.  If you have a family history of colorectal cancer, talk with your health care provider about genetic screening.  Your health care provider may also recommend using home test kits to check for hidden blood in your stool.  A small camera at the end of a tube can be used to examine your colon directly (sigmoidoscopy or colonoscopy). This is done to check for the earliest forms of colorectal cancer.  Direct examination of the colon should be repeated every  5-10 years until age 71. However, if early forms of precancerous polyps or small growths are found or if you have a family history or genetic risk for colorectal cancer, you may need to be screened more often.  Skin Cancer  Check your skin from head to toe regularly.  Monitor any moles. Be sure to tell your health care provider: ? About any new moles or changes in moles, especially if there is a change in a mole's shape or color. ? If you have a mole that is larger than the size of a pencil eraser.  If any of your family members has a history of skin cancer, especially at a Jaxsyn Azam age, talk with your health care provider about genetic screening.  Always use sunscreen. Apply sunscreen liberally and repeatedly throughout the day.  Whenever you are outside, protect yourself by wearing long sleeves, pants, a wide-brimmed hat, and sunglasses.  What should I know about osteoporosis? Osteoporosis is a condition in which bone destruction happens more quickly than new bone creation. After menopause, you may be at an increased risk for osteoporosis. To help prevent osteoporosis or the bone fractures that can happen because of osteoporosis, the following is recommended:  If you are 46-71 years old, get at least 1,000 mg of calcium and at least 600 mg of vitamin D per day.  If you are older than age 55 but younger than age 65, get at least 1,200 mg of calcium and at least 600 mg of vitamin D per day.  If you are older than age 54, get at least 1,200 mg of calcium and at least 800 mg of vitamin D per day.  Smoking and excessive alcohol intake increase the risk of osteoporosis. Eat foods that are rich in calcium and vitamin D, and do weight-bearing exercises several times each week as directed by your health care provider. What should I know about how menopause affects my mental health? Depression may occur at any age, but it is more common as you become older. Common symptoms of depression  include:  Low or sad mood.  Changes in sleep patterns.  Changes in appetite or eating patterns.  Feeling an overall lack of motivation or enjoyment of activities that you previously enjoyed.  Frequent crying spells.  Talk with your health care provider if you think that you are experiencing depression. What should I know about immunizations? It is important that you get and maintain your immunizations. These include:  Tetanus, diphtheria, and pertussis (Tdap) booster vaccine.  Influenza every year before the flu season begins.  Pneumonia vaccine.  Shingles vaccine.  Your health care provider may also recommend other immunizations. This information is not intended to replace advice given to you by your health care provider. Make sure you discuss any questions you have with your health care provider. Document Released: 09/10/2005  Document Revised: 02/06/2016 Document Reviewed: 04/22/2015 Elsevier Interactive Patient Education  2018 [employer]  

## 2017-05-24 NOTE — Progress Notes (Addendum)
Hayley Stewart 1965-11-19 [id number]    History:    Presents for annual exam.  Postmenopausal on HRT with no bleeding. Has had good relief of symptoms, reports sleeping and feeling better since starting HRT 2 months ago. As mild breast tenderness since starting HRT. Normal Pap and mammogram history. 09/2016 mammogram normal after ultrasound. Labs through insurance company reports all as normal. 10/2016 benign colon polyp 5 year follow-up. Hypothyroid endocrinologist manages..  Past medical history, past surgical history, family history and social history were all reviewed and documented in the EPIC chart. 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 [employer] on the swim team doing well.   ROS:  A ROS was performed and pertinent positives and negatives are included.  Exam:  Vitals:   05/24/17 1108  BP: 120/82  Weight: 136 lb (61.7 kg)  Height: 5\' 2"  (1.575 m)   Body mass index is 24.87 kg/m.   General appearance:  Normal Thyroid:  Symmetrical, normal in size, without palpable masses or nodularity. Respiratory  Auscultation:  Clear without wheezing or rhonchi Cardiovascular  Auscultation:  Regular rate, without rubs, murmurs or gallops  Edema/varicosities:  Not grossly evident Abdominal  Soft,nontender, without masses, guarding or rebound.  Liver/spleen:  No organomegaly noted  Hernia:  None appreciated  Skin  Inspection:  Grossly normal   Breasts: Examined lying and sitting.     Right: Without masses, retractions, discharge or axillary adenopathy.     Left: Without masses, retractions, discharge or axillary adenopathy. Gentitourinary   Inguinal/mons:  Normal without inguinal adenopathy  External genitalia:  Normal  BUS/Urethra/Skene's glands:  Normal  Vagina:  Normal  Cervix:  Normal  Uterus:  normal in size, shape and contour.  Midline and mobile  Adnexa/parametria:     Rt: Without masses or tenderness.   Lt: Without masses or tenderness.  Anus and  perineum: Normal  Digital rectal exam: Normal sphincter tone without palpated masses or tenderness  Assessment/Plan:  51 y.o. MWF G1 P1 for annual exam with no complaints.  Postmenopausal on HRT with good relief of symptoms/no bleeding Hypothyroid-endocrinologist manages  Plan: Estradiol 0.5 mg by mouth daily, Prometrium 100 mg at bedtime, prescriptions with refills given for both. Reviewed possibly decreasing estradiol if breast tenderness persists. Unable to tolerate estradiol patches due to skin irritation. SBE's, continue annual screening mammogram, calcium rich diet, vitamin D 1000 daily encouraged. Reviewed importance of exercise, decreasing calories, has had an 8 pound weight gain in the past year. Pap with HR HPV typing, new screening guidelines reviewed.    [place], 11:56 AM 05/24/2017

## 2017-05-25 LAB — URINALYSIS W MICROSCOPIC + REFLEX CULTURE
Bacteria, UA: NONE SEEN /HPF
Bilirubin Urine: NEGATIVE
Glucose, UA: NEGATIVE
Hgb urine dipstick: NEGATIVE
Hyaline Cast: NONE SEEN /LPF
Leukocyte Esterase: NEGATIVE
Nitrites, Initial: NEGATIVE
Protein, ur: NEGATIVE
RBC / HPF: NONE SEEN /HPF (ref 0–2)
Specific Gravity, Urine: 1.022 (ref 1.001–1.03)
WBC, UA: NONE SEEN /HPF (ref 0–5)
pH: 5.5 (ref 5.0–8.0)

## 2017-05-25 LAB — NO CULTURE INDICATED

## 2017-05-27 LAB — PAP, TP IMAGING W/ HPV RNA, RFLX HPV TYPE 16,18/45: HPV DNA High Risk: NOT DETECTED

## 2017-09-16 ENCOUNTER — Other Ambulatory Visit: Payer: Self-pay | Admitting: Women's Health

## 2017-09-16 DIAGNOSIS — R921 Mammographic calcification found on diagnostic imaging of breast: Secondary | ICD-10-CM

## 2017-11-08 ENCOUNTER — Ambulatory Visit
Admission: RE | Admit: 2017-11-08 | Discharge: 2017-11-08 | Disposition: A | Discharge status: Home / Self Care | Payer: 59 | Source: Ambulatory Visit | Attending: Women's Health | Admitting: Women's Health

## 2017-11-08 DIAGNOSIS — R921 Mammographic calcification found on diagnostic imaging of breast: Secondary | ICD-10-CM

## 2018-01-24 DIAGNOSIS — J358 Other chronic diseases of tonsils and adenoids: Secondary | ICD-10-CM | POA: Insufficient documentation

## 2018-01-24 DIAGNOSIS — J383 Other diseases of vocal cords: Secondary | ICD-10-CM | POA: Insufficient documentation

## 2018-02-01 ENCOUNTER — Telehealth: Payer: Self-pay | Admitting: Neurology

## 2018-02-01 NOTE — Telephone Encounter (Signed)
Lattie Haw with Dr. Redmond Baseman' office calling to discuss appointment for this patient. She can be reached at [phone].

## 2018-02-06 NOTE — Telephone Encounter (Signed)
I called and spoke with Lattie Haw at Dr. Redmond Baseman office, she wanted to inform us that this patient was going to hold off on injections for now.

## 2018-04-22 ENCOUNTER — Other Ambulatory Visit: Payer: Self-pay | Admitting: Women's Health

## 2018-04-22 DIAGNOSIS — Z79890 Hormone replacement therapy (postmenopausal): Secondary | ICD-10-CM

## 2018-05-03 ENCOUNTER — Other Ambulatory Visit: Payer: Self-pay | Admitting: Women's Health

## 2018-05-03 DIAGNOSIS — Z79890 Hormone replacement therapy (postmenopausal): Secondary | ICD-10-CM

## 2018-05-04 NOTE — Telephone Encounter (Signed)
Annual exam scheduled 06/27/18

## 2018-06-27 ENCOUNTER — Encounter: Payer: 59 | Admitting: Women's Health

## 2018-08-20 ENCOUNTER — Other Ambulatory Visit: Payer: Self-pay | Admitting: Women's Health

## 2018-08-20 DIAGNOSIS — Z79890 Hormone replacement therapy (postmenopausal): Secondary | ICD-10-CM

## 2018-08-25 ENCOUNTER — Encounter: Payer: 59 | Admitting: Plastic Surgery

## 2018-09-08 ENCOUNTER — Other Ambulatory Visit: Payer: Self-pay | Admitting: Women's Health

## 2018-09-08 DIAGNOSIS — Z79890 Hormone replacement therapy (postmenopausal): Secondary | ICD-10-CM

## 2018-11-23 ENCOUNTER — Other Ambulatory Visit: Payer: Self-pay

## 2018-11-27 ENCOUNTER — Ambulatory Visit: Payer: 59 | Admitting: Women's Health

## 2018-11-27 ENCOUNTER — Other Ambulatory Visit: Payer: Self-pay

## 2018-11-27 ENCOUNTER — Encounter: Payer: Self-pay | Admitting: Women's Health

## 2018-11-27 VITALS — BP 118/76 | Ht 61.5 in | Wt 136.0 lb

## 2018-11-27 DIAGNOSIS — Z01419 Encounter for gynecological examination (general) (routine) without abnormal findings: Secondary | ICD-10-CM

## 2018-11-27 DIAGNOSIS — Z79890 Hormone replacement therapy (postmenopausal): Secondary | ICD-10-CM | POA: Diagnosis not present

## 2018-11-27 LAB — CBC WITH DIFFERENTIAL/PLATELET
Absolute Monocytes: 458 cells/uL (ref 200–950)
Basophils Absolute: 52 cells/uL (ref 0–200)
Basophils Relative: 1 %
Eosinophils Absolute: 120 cells/uL (ref 15–500)
Eosinophils Relative: 2.3 %
HCT: 39.9 % (ref 35.0–45.0)
Hemoglobin: 13.5 g/dL (ref 11.7–15.5)
Lymphs Abs: 1134 cells/uL (ref 850–3900)
MCH: 30.4 pg (ref 27.0–33.0)
MCHC: 33.8 g/dL (ref 32.0–36.0)
MCV: 89.9 fL (ref 80.0–100.0)
MPV: 9.4 fL (ref 7.5–12.5)
Monocytes Relative: 8.8 %
Neutro Abs: 3437 cells/uL (ref 1500–7800)
Neutrophils Relative %: 66.1 %
Platelets: 375 10*3/uL (ref 140–400)
RBC: 4.44 10*6/uL (ref 3.80–5.10)
RDW: 12 % (ref 11.0–15.0)
Total Lymphocyte: 21.8 %
WBC: 5.2 10*3/uL (ref 3.8–10.8)

## 2018-11-27 MED ORDER — LEVOTHYROXINE SODIUM 75 MCG PO TABS: 75.0000 ug | ORAL_TABLET | Freq: Every day | ORAL | 4 refills | Status: AC

## 2018-11-27 MED ORDER — PROGESTERONE MICRONIZED 100 MG PO CAPS: ORAL_CAPSULE | ORAL | 4 refills | Status: DC

## 2018-11-27 MED ORDER — ESTRADIOL 0.5 MG PO TABS: ORAL_TABLET | ORAL | 4 refills | Status: DC

## 2018-11-27 NOTE — Progress Notes (Signed)
Hayley Stewart 22-Dec-1965 [id number]    History:    Presents for annual exam.  Postmenopausal, stopped HRT 2 months ago and is having increased hot flushes, not feeling as well, no bleeding.  Normal Pap and mammogram history.  2018 adenomatous colon polyp 5-year follow-up.  Past medical history, past surgical history, family history and social history were all reviewed and documented in the EPIC chart.  [employer] 17, swim team, doing well, going to [employer] got a scholarship [employer] series completed.  Works for the city of [place]..  ROS:  A ROS was performed and pertinent positives and negatives are included.  Exam:  Vitals:   11/27/18 1031  BP: 118/76  Weight: 136 lb (61.7 kg)  Height: 5' 1.5" (1.562 m)   Body mass index is 25.28 kg/m.   General appearance:  Normal Thyroid:  Symmetrical, normal in size, without palpable masses or nodularity. Respiratory  Auscultation:  Clear without wheezing or rhonchi Cardiovascular  Auscultation:  Regular rate, without rubs, murmurs or gallops  Edema/varicosities:  Not grossly evident Abdominal  Soft,nontender, without masses, guarding or rebound.  Liver/spleen:  No organomegaly noted  Hernia:  None appreciated  Skin  Inspection:  Grossly normal   Breasts: Examined lying and sitting.     Right: Without masses, retractions, discharge or axillary adenopathy.     Left: Without masses, retractions, discharge or axillary adenopathy. Gentitourinary   Inguinal/mons:  Normal without inguinal adenopathy  External genitalia:  Normal  BUS/Urethra/Skene's glands:  Normal  Vagina: Mild atrophy  Cervix:  Normal  Uterus:  normal in size, shape and contour.  Midline and mobile  Adnexa/parametria:     Rt: Without masses or tenderness.   Lt: Without masses or tenderness.  Anus and perineum: Normal  Digital rectal exam: Normal sphincter tone without palpated masses or tenderness  Assessment/Plan:  53 y.o. MWF G1, P1 for annual  exam.    Postmenopausal with no bleeding HRT management Hypothyroid-endocrinologist manages  Plan: HRT reviewed slight risks of blood clots, strokes and breast cancer, would like to take the smallest amount but states did feel better when on it.   Prometrium 100 mg at  bedtime and estradiol.5  1/2 tablet daily (difficulty with the patch adhesive), will call if symptoms do not resolve.  SBEs, continue annual screening mammogram, calcium rich foods, vitamin D 2000 daily.  Encouraged to continue weightbearing and balance type exercise.  Pap normal 2018, new screening guidelines reviewed.  Normal glucose and lipid panel at health screening at work.  CBC.Marland Kitchen   [place], 11:01 AM 11/27/2018

## 2018-11-27 NOTE — Patient Instructions (Signed)
Health Maintenance for Postmenopausal Women Menopause is a normal process in which your reproductive ability comes to an end. This process happens gradually over a span of months to years, usually between the ages of 62 and 89. Menopause is complete when you have missed 12 consecutive menstrual periods. It is important to talk with your health care provider about some of the most common conditions that affect postmenopausal women, such as heart disease, cancer, and bone loss (osteoporosis). Adopting a healthy lifestyle and getting preventive care can help to promote your health and wellness. Those actions can also lower your chances of developing some of these common conditions. What should I know about menopause? During menopause, you may experience a number of symptoms, such as:  Moderate-to-severe hot flashes.  Night sweats.  Decrease in sex drive.  Mood swings.  Headaches.  Tiredness.  Irritability.  Memory problems.  Insomnia. Choosing to treat or not to treat menopausal changes is an individual decision that you make with your health care provider. What should I know about hormone replacement therapy and supplements? Hormone therapy products are effective for treating symptoms that are associated with menopause, such as hot flashes and night sweats. Hormone replacement carries certain risks, especially as you become older. If you are thinking about using estrogen or estrogen with progestin treatments, discuss the benefits and risks with your health care provider. What should I know about heart disease and stroke? Heart disease, heart attack, and stroke become more likely as you age. This may be due, in part, to the hormonal changes that your body experiences during menopause. These can affect how your body processes dietary fats, triglycerides, and cholesterol. Heart attack and stroke are both medical emergencies. There are many things that you can do to help prevent heart disease  and stroke:  Have your blood pressure checked at least every 1-2 years. High blood pressure causes heart disease and increases the risk of stroke.  If you are 79-72 years old, ask your health care provider if you should take aspirin to prevent a heart attack or a stroke.  Do not use any tobacco products, including cigarettes, chewing tobacco, or electronic cigarettes. If you need help quitting, ask your health care provider.  It is important to eat a healthy diet and maintain a healthy weight. ? Be sure to include plenty of vegetables, fruits, low-fat dairy products, and lean protein. ? Avoid eating foods that are high in solid fats, added sugars, or salt (sodium).  Get regular exercise. This is one of the most important things that you can do for your health. ? Try to exercise for at least 150 minutes each week. The type of exercise that you do should increase your heart rate and make you sweat. This is known as moderate-intensity exercise. ? Try to do strengthening exercises at least twice each week. Do these in addition to the moderate-intensity exercise.  Know your numbers.Ask your health care provider to check your cholesterol and your blood glucose. Continue to have your blood tested as directed by your health care provider.  What should I know about cancer screening? There are several types of cancer. Take the following steps to reduce your risk and to catch any cancer development as early as possible. Breast Cancer  Practice breast self-awareness. ? This means understanding how your breasts normally appear and feel. ? It also means doing regular breast self-exams. Let your health care provider know about any changes, no matter how small.  If you are 40 or  older, have a clinician do a breast exam (clinical breast exam or CBE) every year. Depending on your age, family history, and medical history, it may be recommended that you also have a yearly breast X-ray (mammogram).  If you  have a family history of breast cancer, talk with your health care provider about genetic screening.  If you are at high risk for breast cancer, talk with your health care provider about having an MRI and a mammogram every year.  Breast cancer (BRCA) gene test is recommended for women who have family members with BRCA-related cancers. Results of the assessment will determine the need for genetic counseling and BRCA1 and for BRCA2 testing. BRCA-related cancers include these types: ? Breast. This occurs in males or females. ? Ovarian. ? Tubal. This may also be called fallopian tube cancer. ? Cancer of the abdominal or pelvic lining (peritoneal cancer). ? Prostate. ? Pancreatic. Cervical, Uterine, and Ovarian Cancer Your health care provider may recommend that you be screened regularly for cancer of the pelvic organs. These include your ovaries, uterus, and vagina. This screening involves a pelvic exam, which includes checking for microscopic changes to the surface of your cervix (Pap test).  For women ages 21-65, health care providers may recommend a pelvic exam and a Pap test every three years. For women ages 39-65, they may recommend the Pap test and pelvic exam, combined with testing for human papilloma virus (HPV), every five years. Some types of HPV increase your risk of cervical cancer. Testing for HPV may also be done on women of any age who have unclear Pap test results.  Other health care providers may not recommend any screening for nonpregnant women who are considered low risk for pelvic cancer and have no symptoms. Ask your health care provider if a screening pelvic exam is right for you.  If you have had past treatment for cervical cancer or a condition that could lead to cancer, you need Pap tests and screening for cancer for at least 20 years after your treatment. If Pap tests have been discontinued for you, your risk factors (such as having a new sexual partner) need to be reassessed  to determine if you should start having screenings again. Some women have medical problems that increase the chance of getting cervical cancer. In these cases, your health care provider may recommend that you have screening and Pap tests more often.  If you have a family history of uterine cancer or ovarian cancer, talk with your health care provider about genetic screening.  If you have vaginal bleeding after reaching menopause, tell your health care provider.  There are currently no reliable tests available to screen for ovarian cancer. Lung Cancer Lung cancer screening is recommended for adults 57-50 years old who are at high risk for lung cancer because of a history of smoking. A yearly low-dose CT scan of the lungs is recommended if you:  Currently smoke.  Have a history of at least 30 pack-years of smoking and you currently smoke or have quit within the past 15 years. A pack-year is smoking an average of one pack of cigarettes per day for one year. Yearly screening should:  Continue until it has been 15 years since you quit.  Stop if you develop a health problem that would prevent you from having lung cancer treatment. Colorectal Cancer  This type of cancer can be detected and can often be prevented.  Routine colorectal cancer screening usually begins at age 12 and continues through  age 63.  If you have risk factors for colon cancer, your health care provider may recommend that you be screened at an earlier age.  If you have a family history of colorectal cancer, talk with your health care provider about genetic screening.  Your health care provider may also recommend using home test kits to check for hidden blood in your stool.  A small camera at the end of a tube can be used to examine your colon directly (sigmoidoscopy or colonoscopy). This is done to check for the earliest forms of colorectal cancer.  Direct examination of the colon should be repeated every 5-10 years until  age 75. However, if early forms of precancerous polyps or small growths are found or if you have a family history or genetic risk for colorectal cancer, you may need to be screened more often. Skin Cancer  Check your skin from head to toe regularly.  Monitor any moles. Be sure to tell your health care provider: ? About any new moles or changes in moles, especially if there is a change in a mole's shape or color. ? If you have a mole that is larger than the size of a pencil eraser.  If any of your family members has a history of skin cancer, especially at a Emelyn Roen age, talk with your health care provider about genetic screening.  Always use sunscreen. Apply sunscreen liberally and repeatedly throughout the day.  Whenever you are outside, protect yourself by wearing long sleeves, pants, a wide-brimmed hat, and sunglasses. What should I know about osteoporosis? Osteoporosis is a condition in which bone destruction happens more quickly than new bone creation. After menopause, you may be at an increased risk for osteoporosis. To help prevent osteoporosis or the bone fractures that can happen because of osteoporosis, the following is recommended:  If you are 59-59 years old, get at least 1,000 mg of calcium and at least 600 mg of vitamin D per day.  If you are older than age 36 but younger than age 32, get at least 1,200 mg of calcium and at least 600 mg of vitamin D per day.  If you are older than age 47, get at least 1,200 mg of calcium and at least 800 mg of vitamin D per day. Smoking and excessive alcohol intake increase the risk of osteoporosis. Eat foods that are rich in calcium and vitamin D, and do weight-bearing exercises several times each week as directed by your health care provider. What should I know about how menopause affects my mental health? Depression may occur at any age, but it is more common as you become older. Common symptoms of depression include:  Low or sad mood.   Changes in sleep patterns.  Changes in appetite or eating patterns.  Feeling an overall lack of motivation or enjoyment of activities that you previously enjoyed.  Frequent crying spells. Talk with your health care provider if you think that you are experiencing depression. What should I know about immunizations? It is important that you get and maintain your immunizations. These include:  Tetanus, diphtheria, and pertussis (Tdap) booster vaccine.  Influenza every year before the flu season begins.  Pneumonia vaccine.  Shingles vaccine. Your health care provider may also recommend other immunizations. This information is not intended to replace advice given to you by your health care provider. Make sure you discuss any questions you have with your health care provider. Document Released: 09/10/2005 Document Revised: 02/06/2016 Document Reviewed: 04/22/2015 Elsevier Interactive Patient Education  2019 [place].

## 2019-02-27 ENCOUNTER — Other Ambulatory Visit: Payer: Self-pay | Admitting: Women's Health

## 2019-02-27 DIAGNOSIS — Z1231 Encounter for screening mammogram for malignant neoplasm of breast: Secondary | ICD-10-CM

## 2019-04-12 ENCOUNTER — Ambulatory Visit: Payer: 59

## 2019-05-22 ENCOUNTER — Other Ambulatory Visit: Payer: Self-pay

## 2019-05-22 ENCOUNTER — Ambulatory Visit
Admission: RE | Admit: 2019-05-22 | Discharge: 2019-05-22 | Disposition: A | Discharge status: Home / Self Care | Payer: 59 | Source: Ambulatory Visit | Attending: Women's Health | Admitting: Women's Health

## 2019-05-22 DIAGNOSIS — Z1231 Encounter for screening mammogram for malignant neoplasm of breast: Secondary | ICD-10-CM

## 2019-10-30 ENCOUNTER — Encounter: Payer: Self-pay | Admitting: Plastic Surgery

## 2019-10-30 ENCOUNTER — Ambulatory Visit (INDEPENDENT_AMBULATORY_CARE_PROVIDER_SITE_OTHER): Payer: Self-pay | Admitting: Plastic Surgery

## 2019-10-30 ENCOUNTER — Other Ambulatory Visit: Payer: Self-pay

## 2019-10-30 DIAGNOSIS — Z719 Counseling, unspecified: Secondary | ICD-10-CM

## 2019-10-31 ENCOUNTER — Encounter: Payer: Self-pay | Admitting: Plastic Surgery

## 2019-10-31 DIAGNOSIS — Z719 Counseling, unspecified: Secondary | ICD-10-CM | POA: Insufficient documentation

## 2019-10-31 NOTE — Progress Notes (Signed)
   Subjective:    Patient ID: Hayley Stewart, female    DOB: 1966-01-07, 54 y.o.   MRN: [id number]  The patient is a very nice 54 year old female who looks younger than her stated age.  She is interested in facial rejuvenation.  She notices some loss of fullness in her midface.  She has mild wrinkling and mild redness in her facial skin.  She has a prominent glabellar crease and mild forehead lines.  She has had filler and Botox in the past.  Its been a couple years since she has had it.  She is otherwise in very good health.     Review of Systems  Constitutional: Negative.   HENT: Negative.   Eyes: Negative.   Respiratory: Negative.   Gastrointestinal: Negative.   Endocrine: Negative.   Musculoskeletal: Negative.        Objective:   Physical Exam Nursing note reviewed.  Constitutional:      Appearance: Normal appearance.  Cardiovascular:     Rate and Rhythm: Normal rate.  Pulmonary:     Effort: Pulmonary effort is normal.  Skin:    General: Skin is warm.  Neurological:     General: No focal deficit present.     Mental Status: She is alert and oriented to person, place, and time.  Psychiatric:        Mood and Affect: Mood normal.        Behavior: Behavior normal.       Assessment & Plan:     ICD-10-CM   1. Encounter for counseling  Z71.9     We discussed options for facial rejuvenation for this patient it includes Botox of the forehead filler to the midface and laser.  She is going to think things over and talk with her husband.  She will let us know how she would like to proceed.

## 2019-11-13 ENCOUNTER — Encounter: Payer: Self-pay | Admitting: Plastic Surgery

## 2019-11-13 ENCOUNTER — Ambulatory Visit (INDEPENDENT_AMBULATORY_CARE_PROVIDER_SITE_OTHER): Payer: Self-pay | Admitting: Plastic Surgery

## 2019-11-13 ENCOUNTER — Other Ambulatory Visit: Payer: Self-pay

## 2019-11-13 VITALS — BP 114/78 | HR 83 | Temp 97.8°F | Ht 62.0 in | Wt 138.0 lb

## 2019-11-13 DIAGNOSIS — Z719 Counseling, unspecified: Secondary | ICD-10-CM

## 2019-11-13 NOTE — Progress Notes (Signed)
Botulinum Toxin Procedure Note  Procedure: Cosmetic botulinum toxin  Pre-operative Diagnosis: Dynamic rhytides   Post-operative Diagnosis: Same  Complications:  None  Brief history: The patient desires botulinum toxin injection of her forehead. I discussed with the patient this proposed procedure of botulinum toxin injections, which is customized depending on the particular needs of the patient. It is performed on facial rhytids as a temporary correction. The alternatives were discussed with the patient. The risks were addressed including bleeding, scarring, infection, damage to deeper structures, asymmetry, and chronic pain, which may occur infrequently after a procedure. The individual's choice to undergo a surgical procedure is based on the comparison of risks to potential benefits. Other risks include unsatisfactory results, brow ptosis, eyelid ptosis, allergic reaction, temporary paralysis, which should go away with time, bruising, blurring disturbances and delayed healing. Botulinum toxin injections do not arrest the aging process or produce permanent tightening of the eyelid.  Operative intervention maybe necessary to maintain the results of a blepharoplasty or botulinum toxin. The patient understands and wishes to proceed.  Procedure: The area was prepped with alcohol and dried with a clean gauze. Using a clean technique, the botulinum toxin was diluted with 1.25 cc of preservative-free normal saline which was slowly injected with an 18 gauge needle in a tuberculin syringes.  A 32 gauge needles were then used to inject the botulinum toxin. This mixture allow for an aliquot of 5 units per 0.1 cc in each injection site.    Subsequently the mixture was injected in the glabellar and forehead area with preservation of the temporal branch to the lateral eyebrow as well as into each lateral canthal area beginning from the lateral orbital rim medial to the zygomaticus major in 3 separate areas. A total  of 30 Units of botulinum toxin was used. The forehead and glabellar area was injected with care to inject intramuscular only while holding pressure on the supratrochlear vessels in each area during each injection on either side of the medial corrugators. The injection proceeded vertically superiorly to the medial 2/3 of the frontalis muscle and superior 2/3 of the lateral frontalis, again with preservation of the frontal branch.  The midface area was injected at the 3 sub-regions of the mid-face: zygomaticomalar region, anteromedial cheek region, and submalar region for a total of one syringe on each side of the face. The technique used was serial puncture with equal injections in the 3 sub-regions: the zygomaticomalar region, the anteromedial cheek, and the submalar region.  No complications were noted. Light pressure was held for 5 minutes. She was instructed explicitly in post-operative care.  Botox LOT:  [id number] C4 EXP:  12/2021

## 2019-11-29 ENCOUNTER — Encounter: Payer: 59 | Admitting: Nurse Practitioner

## 2019-12-25 ENCOUNTER — Other Ambulatory Visit: Payer: Self-pay | Admitting: *Deleted

## 2019-12-25 DIAGNOSIS — Z79890 Hormone replacement therapy (postmenopausal): Secondary | ICD-10-CM

## 2019-12-27 ENCOUNTER — Other Ambulatory Visit: Payer: Self-pay | Admitting: *Deleted

## 2019-12-27 DIAGNOSIS — Z79890 Hormone replacement therapy (postmenopausal): Secondary | ICD-10-CM

## 2019-12-27 MED ORDER — ESTRADIOL 0.5 MG PO TABS: ORAL_TABLET | ORAL | 0 refills | Status: DC

## 2019-12-27 NOTE — Telephone Encounter (Signed)
Annual exam scheduled on 03/25/20

## 2020-02-11 ENCOUNTER — Other Ambulatory Visit: Payer: Self-pay

## 2020-02-11 ENCOUNTER — Ambulatory Visit (INDEPENDENT_AMBULATORY_CARE_PROVIDER_SITE_OTHER): Payer: Self-pay | Admitting: Plastic Surgery

## 2020-02-11 ENCOUNTER — Encounter: Payer: Self-pay | Admitting: Plastic Surgery

## 2020-02-11 VITALS — BP 127/81 | HR 83 | Temp 98.4°F

## 2020-02-11 DIAGNOSIS — Z719 Counseling, unspecified: Secondary | ICD-10-CM

## 2020-02-11 NOTE — Progress Notes (Signed)
Filler Injection Procedure Note  Procedure:  Filler administration  Pre-operative Diagnosis: Rytides and midface volume loss  Post-operative Diagnosis: Same  Surgeon: Electronically signed by: Theodoro Kos, DO   Complications:  None  Brief history: The patient desires injection with fillers in her face. I discussed with the patient this proposed procedure of filler injections, which is customized depending on the particular needs of the patient. It is performed on facial volume loss as a temporary correction. The alternatives were discussed with the patient. The risks were addressed including bleeding, scarring, infection, damage to deeper structures, asymmetry, and chronic pain, which may occur infrequently after a procedure. The individual's choice to undergo a surgical procedure is based on the comparison of risks to potential benefits. Other risks include unsatisfactory results, allergic reaction, which should go away with time, bruising and delayed healing. Fillers do not arrest the aging process or produce permanent tightening.  Operative intervention maybe necessary to maintain the results. The patient understands and wishes to proceed. An informed consent was signed and informational brochures given to her prior to the procedure.  Procedure: The area was prepped with chlorhexidine and dried with a clean gauze. Using a clean technique, a 30 gauge needle was then used.  The midface area was injected at the medial sub-region of the mid-face.  No complications were noted. Light pressure was held for 5 minutes. She was instructed explicitly in post-operative care.  Restylane Lyft LOT: [id number] EXP: 2022-08-01

## 2020-02-14 ENCOUNTER — Telehealth: Payer: Self-pay

## 2020-02-14 NOTE — Telephone Encounter (Signed)
LMOM for pt to schedule NP appt. Per Dr. Einar Gip she needs to be seen for family history of vascular disease, routine check.

## 2020-02-15 ENCOUNTER — Other Ambulatory Visit: Payer: Self-pay

## 2020-02-15 MED ORDER — PROGESTERONE MICRONIZED 100 MG PO CAPS
100.0000 mg | ORAL_CAPSULE | Freq: Every day | ORAL | 0 refills | Status: DC
Start: 2020-02-15 — End: 2020-03-25

## 2020-02-15 NOTE — Telephone Encounter (Signed)
Has CE scheduled in August with TW.

## 2020-02-15 NOTE — Telephone Encounter (Signed)
Appt made

## 2020-03-25 ENCOUNTER — Encounter: Payer: Self-pay | Admitting: Nurse Practitioner

## 2020-03-25 ENCOUNTER — Other Ambulatory Visit: Payer: Self-pay

## 2020-03-25 ENCOUNTER — Ambulatory Visit (INDEPENDENT_AMBULATORY_CARE_PROVIDER_SITE_OTHER): Payer: 59 | Admitting: Nurse Practitioner

## 2020-03-25 VITALS — BP 118/76 | Ht 61.0 in | Wt 139.0 lb

## 2020-03-25 DIAGNOSIS — Z01419 Encounter for gynecological examination (general) (routine) without abnormal findings: Secondary | ICD-10-CM | POA: Diagnosis not present

## 2020-03-25 DIAGNOSIS — Z79890 Hormone replacement therapy (postmenopausal): Secondary | ICD-10-CM

## 2020-03-25 MED ORDER — ESTRADIOL 0.5 MG PO TABS: ORAL_TABLET | ORAL | 4 refills | Status: DC

## 2020-03-25 MED ORDER — PROGESTERONE MICRONIZED 100 MG PO CAPS: 100.0000 mg | ORAL_CAPSULE | Freq: Every day | ORAL | 2 refills | Status: DC

## 2020-03-25 NOTE — Patient Instructions (Signed)

## 2020-03-25 NOTE — Progress Notes (Signed)
   Hayley Stewart August 15, 1965 [id number]   History:  54 y.o. G1P1 presents for annual exam. Postmenopausal - on Estrace and Prometrium daily with good relief of hot flashes, no bleeding. She tried to stop HRT in the past but could not tolerate. Normal pap and mammogram history. Thyroidectomy around 10 years ago, managed by endocrinology.   Gynecologic History Contraception: post menopausal status Last Pap: 05/24/2017. Results were: normal Last mammogram: 05/23/2019. Results were: normal  Last colonoscopy: 11/01/2016. Results were: polyp, 5 year follow up recommended  Past medical history, past surgical history, family history and social history were all reviewed and documented in the EPIC chart.  ROS:  A ROS was performed and pertinent positives and negatives are included.  Exam:  Vitals:   03/25/20 0926  Weight: 139 lb (63 kg)  Height: 5\' 1"  (1.549 m)   Body mass index is 26.26 kg/m.  General appearance:  Normal Thyroid:  Symmetrical, normal in size, without palpable masses or nodularity. Respiratory  Auscultation:  Clear without wheezing or rhonchi Cardiovascular  Auscultation:  Regular rate, without rubs, murmurs or gallops  Edema/varicosities:  Not grossly evident Abdominal  Soft,nontender, without masses, guarding or rebound.  Liver/spleen:  No organomegaly noted  Hernia:  None appreciated  Skin  Inspection:  Grossly normal   Breasts: Examined lying and sitting.   Right: Without masses, retractions, discharge or axillary adenopathy.   Left: Without masses, retractions, discharge or axillary adenopathy. Gentitourinary   Inguinal/mons:  Normal without inguinal adenopathy  External genitalia:  Normal  BUS/Urethra/Skene's glands:  Normal  Vagina:  Normal  Cervix:  Normal, ectropion  Uterus:  Anteverted, normal in size, shape and contour.  Midline and mobile  Adnexa/parametria:     Rt: Without masses or tenderness.   Lt: Without masses or tenderness.  Anus and  perineum: Normal  Digital rectal exam: Normal sphincter tone without palpated masses or tenderness  Assessment/Plan:  54 y.o. G1P1 for annual exam.   Well female exam with routine gynecological exam - Education provided on SBEs, importance of preventative screenings, current guidelines, high calcium diet, regular exercise, and multivitamin daily.   Hormone replacement therapy (HRT) - Plan: estradiol (ESTRACE) 0.5 MG tablet, progesterone (PROMETRIUM) 100 MG capsule. Tried to wean in the past but could not tolerate hot flashes. She is aware of risks for blood clots, heart attack, stroke, and breast cancer. Refill x 1 year provided.   Follow up in 1 year for annual.    [name] [facility], 9:28 AM 03/25/2020

## 2020-04-01 ENCOUNTER — Ambulatory Visit: Payer: 59 | Admitting: Cardiology

## 2020-04-15 ENCOUNTER — Ambulatory Visit: Payer: 59 | Admitting: Cardiology

## 2020-04-15 ENCOUNTER — Other Ambulatory Visit: Payer: Self-pay

## 2020-04-15 ENCOUNTER — Encounter: Payer: Self-pay | Admitting: Cardiology

## 2020-04-15 VITALS — BP 134/75 | HR 84 | Resp 16 | Ht 61.0 in | Wt 142.6 lb

## 2020-04-15 DIAGNOSIS — R012 Other cardiac sounds: Secondary | ICD-10-CM

## 2020-04-15 DIAGNOSIS — R0989 Other specified symptoms and signs involving the circulatory and respiratory systems: Secondary | ICD-10-CM

## 2020-04-15 DIAGNOSIS — E7800 Pure hypercholesterolemia, unspecified: Secondary | ICD-10-CM

## 2020-04-15 DIAGNOSIS — Z8249 Family history of ischemic heart disease and other diseases of the circulatory system: Secondary | ICD-10-CM

## 2020-04-15 MED ORDER — ATORVASTATIN CALCIUM 20 MG PO TABS: 20.0000 mg | ORAL_TABLET | Freq: Every day | ORAL | 2 refills | Status: DC

## 2020-04-15 NOTE — Progress Notes (Signed)
Primary Physician/Referring:  Glenis Smoker, MD  Patient ID: Hayley Stewart, female    DOB: 07-28-1966, 54 y.o.   MRN: [id number]  Chief Complaint  Patient presents with  . Family History of Vascular Disease  . Annual Exam  . New Patient (Initial Visit)   HPI:    Hayley Stewart  is a 54 y.o. Caucasian female with no significant personal cardiac history, her father had carotid disease in his early 54s.  And needed carotid artery stenting and surgery.  Her maternal grandfather had multiple cardiac revascularization procedures in his 54s., told to have mild hyperlipidemia, has partial thyroidectomy and presently on thyroid supplements presents to me for cardiac risk stratification.  I know her and her husband very well, she is active, exercises fairly regularly, also bicycle as an exercise program regularly.  She has not noticed any chest pain, shortness of breath and remains asymptomatic.  Past Medical History:  Diagnosis Date  . Adenomatous colon polyp 10/2016  . Laryngopharyngeal reflux    Dr. Redmond Baseman (ENT)  . Osteoporosis 2004  . Thyroid disease    Past Surgical History:  Procedure Laterality Date  . BREAST BIOPSY Right 08/20/2003  . BREAST EXCISIONAL BIOPSY Right 08/07/2001  . BREAST SURGERY     right breast biopsy/FIBROMA OF RT BREAST  . THYROIDECTOMY, PARTIAL  09/2009   R lobectomy/BENIGN   Family History  Problem Relation Age of Onset  . Hypertension Mother   . Hyperlipidemia Mother   . Osteoporosis Mother   . Heart disease Maternal Grandfather   . Hypertension Father   . Heart disease Father   . Osteoporosis Maternal Aunt   . Breast cancer Maternal Grandmother   . Osteoporosis Maternal Aunt   . Colon cancer Paternal Uncle   . Esophageal cancer Neg Hx   . Rectal cancer Neg Hx   . Stomach cancer Neg Hx     Social History   Tobacco Use  . Smoking status: Never Smoker  . Smokeless tobacco: Never Used  Substance Use Topics  . Alcohol use: Yes    Comment: 1  drink per month.   Marital Status: Married   ROS  Review of Systems  Cardiovascular: Negative for chest pain, dyspnea on exertion and leg swelling.  Gastrointestinal: Negative for melena.    Objective  Blood pressure 134/75, pulse 84, resp. rate 16, height 5\' 1"  (1.549 m), weight 142 lb 9.6 oz (64.7 kg), last menstrual period 08/12/2015, SpO2 95 %.  Vitals with BMI 04/15/2020 03/25/2020 02/11/2020  Height 5\' 1"  5\' 1"  -  Weight 142 lbs 10 oz 139 lbs -  BMI 62.69 48.54 -  Systolic 627 035 009  Diastolic 75 76 81  Pulse 84 - 83     Physical Exam HENT:     Head: Atraumatic.  Cardiovascular:     Rate and Rhythm: Normal rate and regular rhythm.     Pulses: Normal pulses and intact distal pulses. A midsystolic click.          Carotid pulses are on the left side with bruit.    Heart sounds: S1 normal and S2 normal. Murmur heard.  Mid to late systolic murmur is present with a grade of 1/6 at the apex.  No gallop.      Comments: No edema. No JVD.  Pulmonary:     Effort: Pulmonary effort is normal.     Breath sounds: Normal breath sounds.  Abdominal:     General: Bowel sounds are normal.  Palpations: Abdomen is soft.    Laboratory examination:   Lipid Panel No results for input(s): CHOL, TRIG, LDLCALC, VLDL, HDL, CHOLHDL, LDLDIRECT in the last 8760 hours.  External labs:  12/24/2019:  HDL 61, LDL 163, triglycerides 95, total cholesterol 240.  NHDL Chol 179 BUN 17, serum creatinine 0.99 Glucose 100  06/05/2019: TSH 1.310  Medications and allergies   Allergies  Allergen Reactions  . Nitrofurantoin Monohyd Macro Nausea And Vomiting  . Penicillins Other (See Comments)    Historical from mother.     Outpatient Medications Prior to Visit  Medication Sig Dispense Refill  . estradiol (ESTRACE) 0.5 MG tablet TAKE 1 TABLET(0.5 MG) BY MOUTH DAILY 90 tablet 4  . levothyroxine (SYNTHROID) 75 MCG tablet Take 1 tablet (75 mcg total) by mouth daily. Mon-Sat 90 tablet 4  .  Multiple Vitamins-Minerals (MULTIVITAMIN WITH MINERALS) tablet Take 1 tablet by mouth daily.    . progesterone (PROMETRIUM) 100 MG capsule Take 1 capsule (100 mg total) by mouth daily. 90 capsule 2   No facility-administered medications prior to visit.   Radiology:   No results found.  Cardiac Studies:    EKG  EKG 04/15/2020: Normal sinus rhythm at rate of 88 bpm, normal axis.  No evidence of ischemia, normal EKG.      Assessment     ICD-10-CM   1. Hypercholesteremia  E78.00 CT CARDIAC SCORING    PCV CARDIAC STRESS TEST    LDL cholesterol, direct    Lipid Panel With LDL/HDL Ratio    Lipoprotein A (LPA)    Lipoprotein A (LPA)    Lipid Panel With LDL/HDL Ratio    LDL cholesterol, direct  2. Left carotid bruit  R09.89 PCV CAROTID DUPLEX (BILATERAL)    CT CARDIAC SCORING    PCV CARDIAC STRESS TEST    LDL cholesterol, direct    Lipid Panel With LDL/HDL Ratio    Lipoprotein A (LPA)    Lipoprotein A (LPA)    Lipid Panel With LDL/HDL Ratio    LDL cholesterol, direct  3. Abnormal heart sounds  R01.2   4. Family history of cardiovascular disease  Z82.49 EKG 12-Lead    CT CARDIAC SCORING     There are no discontinued medications.  Meds ordered this encounter  Medications  . atorvastatin (LIPITOR) 20 MG tablet    Sig: Take 1 tablet (20 mg total) by mouth daily.    Dispense:  30 tablet    Refill:  2   Recommendations:   Hayley Stewart is a 54 y.o. Caucasian female with no significant personal cardiac history, her father had carotid disease in his early 54s.  And needed carotid artery stenting and surgery.  Her maternal grandfather had multiple cardiac revascularization procedures in his 54s., told to have mild hyperlipidemia, has partial thyroidectomy and presently on thyroid supplements presents to me for cardiac risk stratification.  She has midsystolic click heard throughout the precordium, and a very soft 1-2 over 6 late systolic murmur at the apex suspect MVP.  Cannot  exclude aortic valve click.  Cardiac troponin echocardiogram.  She also has left carotid soft bruit and will obtain carotid duplex.  With regard to hyperlipidemia, although HDL is normal, and non-HDL and LDL are not at goal and in view of family history of vascular disease, patient already doing her best with regard to lifestyle and exercises avidly, I have recommended starting statins, Lipitor 20 mg will be started, she probably 40 mg.  I discussed the side effects  of Lipitor with the patient.  For stratification I will obtain cardiac coronary calcium score and also routine treadmill stress test, repeat lipids in 6 weeks and see him back in 8 weeks. External labs reviewed.     Adrian Prows, PA-C 04/15/2020, 7:48 PM Office: [phone]

## 2020-04-21 ENCOUNTER — Ambulatory Visit: Payer: 59

## 2020-04-21 ENCOUNTER — Other Ambulatory Visit: Payer: Self-pay

## 2020-04-21 DIAGNOSIS — I6523 Occlusion and stenosis of bilateral carotid arteries: Secondary | ICD-10-CM

## 2020-04-21 DIAGNOSIS — R0989 Other specified symptoms and signs involving the circulatory and respiratory systems: Secondary | ICD-10-CM

## 2020-04-23 ENCOUNTER — Other Ambulatory Visit: Payer: Self-pay

## 2020-04-23 ENCOUNTER — Ambulatory Visit: Payer: 59

## 2020-04-23 DIAGNOSIS — E7800 Pure hypercholesterolemia, unspecified: Secondary | ICD-10-CM

## 2020-04-23 DIAGNOSIS — R0989 Other specified symptoms and signs involving the circulatory and respiratory systems: Secondary | ICD-10-CM

## 2020-05-06 NOTE — Progress Notes (Signed)
Coronary calcium score 05/05/2020: Total calcium score 222.  Which is between 90th 100 percentile for a female patient this age. LAD 108, circumflex 0, RCA 114. Small hiatal hernia.

## 2020-05-20 ENCOUNTER — Telehealth: Payer: Self-pay | Admitting: *Deleted

## 2020-05-20 DIAGNOSIS — Z79890 Hormone replacement therapy (postmenopausal): Secondary | ICD-10-CM

## 2020-05-20 MED ORDER — ESTRADIOL 0.5 MG PO TABS: ORAL_TABLET | ORAL | 3 refills | Status: DC

## 2020-05-20 NOTE — Telephone Encounter (Signed)
Patient called stating she picked up her refill for estradiol 0.5 mg tablet and the directions said 1/2 tablet daily. Patient didn't recall discussing this at the office visit. I explained I see Rx for estradiol 0.5 mg tablet 1 po daily in office note and prescribed on 03/25/20 visit. Patient said she has 2 weeks worth of medication and asked if I could re-send the Rx. This was done patient aware.

## 2020-05-21 ENCOUNTER — Other Ambulatory Visit: Payer: Self-pay

## 2020-05-21 DIAGNOSIS — Z79890 Hormone replacement therapy (postmenopausal): Secondary | ICD-10-CM

## 2020-05-21 MED ORDER — ESTRADIOL 0.5 MG PO TABS: ORAL_TABLET | ORAL | 3 refills | Status: DC

## 2020-05-21 MED ORDER — PROGESTERONE MICRONIZED 100 MG PO CAPS: 100.0000 mg | ORAL_CAPSULE | Freq: Every day | ORAL | 3 refills | Status: DC

## 2020-05-22 ENCOUNTER — Other Ambulatory Visit: Payer: Self-pay

## 2020-05-22 MED ORDER — ATORVASTATIN CALCIUM 20 MG PO TABS: 20.0000 mg | ORAL_TABLET | Freq: Every day | ORAL | 2 refills | Status: DC

## 2020-05-27 ENCOUNTER — Encounter: Payer: 59 | Admitting: Plastic Surgery

## 2020-06-10 ENCOUNTER — Other Ambulatory Visit: Payer: Self-pay

## 2020-06-10 ENCOUNTER — Encounter: Payer: Self-pay | Admitting: Plastic Surgery

## 2020-06-10 ENCOUNTER — Ambulatory Visit (INDEPENDENT_AMBULATORY_CARE_PROVIDER_SITE_OTHER): Payer: Self-pay | Admitting: Plastic Surgery

## 2020-06-10 VITALS — BP 128/73 | HR 84 | Temp 98.1°F

## 2020-06-10 DIAGNOSIS — Z719 Counseling, unspecified: Secondary | ICD-10-CM

## 2020-06-10 NOTE — Progress Notes (Signed)

## 2020-06-13 LAB — LIPID PANEL WITH LDL/HDL RATIO
Cholesterol, Total: 155 mg/dL (ref 100–199)
HDL: 62 mg/dL (ref >39)
LDL Chol Calc (NIH): 81 mg/dL (ref 0–99)
LDL/HDL Ratio: 1.3 ratio (ref 0.0–3.2)
Triglycerides: 62 mg/dL (ref 0–149)
VLDL Cholesterol Cal: 12 mg/dL (ref 5–40)

## 2020-06-13 LAB — LIPOPROTEIN A (LPA): Lipoprotein (a): 26 nmol/L (ref <75.0)

## 2020-06-13 LAB — LDL CHOLESTEROL, DIRECT: LDL Direct: 81 mg/dL (ref 0–99)

## 2020-06-13 NOTE — Progress Notes (Signed)
12/24/2019:  HDL 61, LDL 163, triglycerides 95, total cholesterol 240.  NHDL Chol 179  Significant improvement in LDL profile.  Continue present medical therapy.

## 2020-06-16 ENCOUNTER — Ambulatory Visit: Payer: 59 | Admitting: Cardiology

## 2020-06-16 ENCOUNTER — Encounter: Payer: Self-pay | Admitting: Cardiology

## 2020-06-16 ENCOUNTER — Other Ambulatory Visit: Payer: Self-pay

## 2020-06-16 VITALS — BP 108/76 | HR 83 | Resp 16 | Ht 61.0 in | Wt 140.3 lb

## 2020-06-16 DIAGNOSIS — R931 Abnormal findings on diagnostic imaging of heart and coronary circulation: Secondary | ICD-10-CM

## 2020-06-16 DIAGNOSIS — E7800 Pure hypercholesterolemia, unspecified: Secondary | ICD-10-CM

## 2020-06-16 DIAGNOSIS — R011 Cardiac murmur, unspecified: Secondary | ICD-10-CM

## 2020-06-16 DIAGNOSIS — I6523 Occlusion and stenosis of bilateral carotid arteries: Secondary | ICD-10-CM

## 2020-06-16 DIAGNOSIS — Z8249 Family history of ischemic heart disease and other diseases of the circulatory system: Secondary | ICD-10-CM

## 2020-06-16 MED ORDER — ASPIRIN 81 MG PO CHEW: 81.0000 mg | CHEWABLE_TABLET | Freq: Every day | ORAL | Status: DC

## 2020-06-16 MED ORDER — ATORVASTATIN CALCIUM 20 MG PO TABS: 20.0000 mg | ORAL_TABLET | Freq: Every day | ORAL | 3 refills | Status: DC

## 2020-06-16 NOTE — Progress Notes (Signed)
Primary Physician/Referring:  Glenis Smoker, MD  Patient ID: [name], female    DOB: 08-10-1965, 54 y.o.   MRN: [id number]  Chief Complaint  Patient presents with  . Hyperlipidemia  . Follow-up    2 months   HPI:    Hayley Stewart  is a 54 y.o. Caucasian female with no significant personal cardiac history, her father had carotid disease in his early 20s.  And needed carotid artery stenting and surgery.  Her maternal grandfather had multiple cardiac revascularization procedures in his 24s, told to have mild hyperlipidemia, has partial thyroidectomy and presently on thyroid supplements presents to me for cardiac risk stratification.   She remains asymptomatic except during exercise notices rapid heartbeat and rapid acceleration in heart rate.  She has no symptoms of claudication or chest pain or palpitations.  Past Medical History:  Diagnosis Date  . Adenomatous colon polyp 10/2016  . Laryngopharyngeal reflux    Dr. Redmond Baseman (ENT)  . Osteoporosis 2004  . Thyroid disease    Past Surgical History:  Procedure Laterality Date  . BREAST BIOPSY Right 08/20/2003  . BREAST EXCISIONAL BIOPSY Right 08/07/2001  . BREAST SURGERY     right breast biopsy/FIBROMA OF RT BREAST  . THYROIDECTOMY, PARTIAL  09/2009   R lobectomy/BENIGN   Family History  Problem Relation Age of Onset  . Hypertension Mother   . Hyperlipidemia Mother   . Osteoporosis Mother   . Heart disease Maternal Grandfather   . Hypertension Father   . Heart disease Father   . Osteoporosis Maternal Aunt   . Breast cancer Maternal Grandmother   . Osteoporosis Maternal Aunt   . Colon cancer Paternal Uncle   . Esophageal cancer Neg Hx   . Rectal cancer Neg Hx   . Stomach cancer Neg Hx     Social History   Tobacco Use  . Smoking status: Never Smoker  . Smokeless tobacco: Never Used  Substance Use Topics  . Alcohol use: Yes    Comment: 1 drink per month.   Marital Status: Married   ROS  Review of Systems    Cardiovascular: Negative for chest pain, dyspnea on exertion and leg swelling.  Gastrointestinal: Negative for melena.    Objective  Blood pressure 108/76, pulse 83, resp. rate 16, height 5\' 1"  (1.549 m), weight 140 lb 4.8 oz (63.6 kg), last menstrual period 08/12/2015, SpO2 99 %.  Vitals with BMI 06/16/2020 06/10/2020 04/15/2020  Height 5\' 1"  - 5\' 1"   Weight 140 lbs 5 oz - 142 lbs 10 oz  BMI 88.41 - 66.06  Systolic 301 601 093  Diastolic 76 73 75  Pulse 83 84 84     Physical Exam HENT:     Head: Atraumatic.  Cardiovascular:     Rate and Rhythm: Normal rate and regular rhythm.     Pulses: Normal pulses and intact distal pulses. A midsystolic click.          Carotid pulses are on the left side with bruit.    Heart sounds: S1 normal and S2 normal. Murmur heard.  Mid to late systolic murmur is present with a grade of 1/6 at the apex.  No gallop.      Comments: No edema. No JVD.  Pulmonary:     Effort: Pulmonary effort is normal.     Breath sounds: Normal breath sounds.  Abdominal:     General: Bowel sounds are normal.     Palpations: Abdomen is soft.  Laboratory examination:   Lipid Panel Recent Labs    06/12/20 1049  CHOL 155  TRIG 62  LDLCALC 81  HDL 62  LDLDIRECT 81    External labs:  12/24/2019:  HDL 61, LDL 163, triglycerides 95, total cholesterol 240.  NHDL Chol 179 BUN 17, serum creatinine 0.99 Glucose 100  06/05/2019: TSH 1.310  Medications and allergies   Allergies  Allergen Reactions  . Nitrofurantoin Monohyd Macro Nausea And Vomiting  . Penicillins Other (See Comments)    Historical from mother.     Outpatient Medications Prior to Visit  Medication Sig Dispense Refill  . estradiol (ESTRACE) 0.5 MG tablet TAKE 1 TABLET(0.5 MG) BY MOUTH DAILY 90 tablet 3  . levothyroxine (SYNTHROID) 75 MCG tablet Take 1 tablet (75 mcg total) by mouth daily. Mon-Sat 90 tablet 4  . Multiple Vitamins-Minerals (MULTIVITAMIN WITH MINERALS) tablet Take 1 tablet by  mouth daily.    . progesterone (PROMETRIUM) 100 MG capsule Take 1 capsule (100 mg total) by mouth daily. 90 capsule 3  . atorvastatin (LIPITOR) 20 MG tablet Take 1 tablet (20 mg total) by mouth daily. 90 tablet 2   No facility-administered medications prior to visit.   Radiology:   No results found.  Cardiac Studies:   Coronary calcium score 05/05/2020: Total calcium score 222. Which is between 90th 100 percentile for a female patient this age. LAD 108, circumflex 0, RCA 114.  Small hiatal hernia.  Carotid artery duplex  04/21/2020: Mild heterogenous plaque in bilateral internal carotid arteries without hemodynamically significant stenosis.  Stenosis in the right external carotid artery (<50%). Antegrade right vertebral artery flow. Antegrade left vertebral artery flow. Follow up is appropriate if clinically indicated.  Exercise treadmill stress test 04/23/2020: Exercise treadmill stress test performed using Bruce protocol.  Patient reached 8.8 METS, and 105% of age predicted maximum heart rate.  Exercise capacity was fair.  No chest pain reported.  Normal heart rate and hemodynamic response. Stress EKG revealed no ischemic changes. Low risk study.  EKG  EKG 04/15/2020: Normal sinus rhythm at rate of 88 bpm, normal axis.  No evidence of ischemia, normal EKG.      Assessment     ICD-10-CM   1. Agatston coronary artery calcium score between 200 and 399  R93.1 Vitamin D 1,25 dihydroxy    PCV ECHOCARDIOGRAM COMPLETE    aspirin (ASPIRIN CHILDRENS) 81 MG chewable tablet  2. Atherosclerosis of both carotid arteries  I65.23 aspirin (ASPIRIN CHILDRENS) 81 MG chewable tablet  3. Hypercholesteremia  E78.00 Vitamin D 1,25 dihydroxy  4. Family history of cardiovascular disease  Z82.49   5. Murmur  R01.1 PCV ECHOCARDIOGRAM COMPLETE     Medications Discontinued During This Encounter  Medication Reason  . atorvastatin (LIPITOR) 20 MG tablet Reorder    Meds ordered this encounter    Medications  . atorvastatin (LIPITOR) 20 MG tablet    Sig: Take 1 tablet (20 mg total) by mouth daily.    Dispense:  90 tablet    Refill:  3  . aspirin (ASPIRIN CHILDRENS) 81 MG chewable tablet    Sig: Chew 1 tablet (81 mg total) by mouth daily.   Recommendations:   LYRICK WORLAND is a 54 y.o. Caucasian female with no significant personal cardiac history, her father had carotid disease in his early 70s.  And needed carotid artery stenting and surgery.  Her maternal grandfather had multiple cardiac revascularization procedures in his 20s, told to have mild hyperlipidemia, has partial thyroidectomy and presently on  thyroid supplements presents to me for cardiac risk stratification.  I have seen her 3 months ago, initiated statin therapy and also obtained cardiac investigations.  Her lipids have significantly improved since being on atorvastatin.  She has not had any significant side effects but although notices mild occasional myalgias.  Will obtain vitamin D levels.    I also reviewed the results of the carotid artery duplex, surprisingly she has heterogenous plaque in bilateral carotid arteries without significant stenosis.  Her coronary calcium score is also moderately elevated and is in the 90th percentile and hence high risk for future cardiovascular events.  I will also start her on aspirin 81 mg daily.  She has midsystolic click heard throughout the precordium, and a very soft 1-2 over 6 late systolic murmur at the apex suspect MVP.  I will obtain an echocardiogram.  Unless abnormal, I will see her back in a year, she will need lipid profile testing prior to her next office visit.  Orders were not placed yet.   Adrian Prows, PA-C 06/16/2020, 4:39 PM Office: [phone]

## 2020-06-18 ENCOUNTER — Other Ambulatory Visit: Payer: 59

## 2020-06-30 ENCOUNTER — Other Ambulatory Visit: Payer: Self-pay

## 2020-06-30 ENCOUNTER — Ambulatory Visit: Payer: 59

## 2020-06-30 DIAGNOSIS — R931 Abnormal findings on diagnostic imaging of heart and coronary circulation: Secondary | ICD-10-CM

## 2020-06-30 DIAGNOSIS — R011 Cardiac murmur, unspecified: Secondary | ICD-10-CM

## 2020-07-08 ENCOUNTER — Other Ambulatory Visit: Payer: Self-pay | Admitting: Family Medicine

## 2020-07-08 DIAGNOSIS — Z1231 Encounter for screening mammogram for malignant neoplasm of breast: Secondary | ICD-10-CM

## 2020-07-11 LAB — VITAMIN D 1,25 DIHYDROXY
Vitamin D 1, 25 (OH)2 Total: 61 pg/mL
Vitamin D2 1, 25 (OH)2: <10 pg/mL
Vitamin D3 1, 25 (OH)2: 61 pg/mL

## 2020-08-05 ENCOUNTER — Telehealth: Payer: Self-pay

## 2020-08-05 NOTE — Telephone Encounter (Signed)
Patient called she has been on lipitor she has been feeling muscle ache and has been having loose stool she doesn't know if that could be a side affect please advise

## 2020-08-20 ENCOUNTER — Ambulatory Visit: Payer: 59

## 2020-08-27 ENCOUNTER — Telehealth: Payer: Self-pay

## 2020-08-27 ENCOUNTER — Encounter: Payer: Self-pay | Admitting: Cardiology

## 2020-08-27 DIAGNOSIS — E7800 Pure hypercholesterolemia, unspecified: Secondary | ICD-10-CM

## 2020-08-27 MED ORDER — ROSUVASTATIN CALCIUM 20 MG PO TABS: 20.0000 mg | ORAL_TABLET | Freq: Every day | ORAL | 1 refills | Status: DC

## 2020-08-27 NOTE — Telephone Encounter (Signed)
Patient called that she has been off lipitor and her muscle aches have resolved patient wanted to let you know. You had a previous conversation with her about changing to a different medication patient wants to know if you still are going to change med please advise

## 2020-08-27 NOTE — Telephone Encounter (Signed)
    ICD-10-CM   1. Hypercholesteremia  E78.00 rosuvastatin (CRESTOR) 20 MG tablet    Lipid Panel With LDL/HDL Ratio   Meds ordered this encounter  Medications  . rosuvastatin (CRESTOR) 20 MG tablet    Sig: Take 1 tablet (20 mg total) by mouth daily.    Dispense:  90 tablet    Refill:  1    Medications Discontinued During This Encounter  Medication Reason  . atorvastatin (LIPITOR) 20 MG tablet Side effect (s)    Orders Placed This Encounter  Procedures  . Lipid Panel With LDL/HDL Ratio    Standing Status:   Future    Standing Expiration Date:   08/27/2021     Adrian Prows, MD, [facility] 08/27/2020, 5:23 PM Office: [phone] Pager: [phone]

## 2020-09-04 ENCOUNTER — Ambulatory Visit
Admission: RE | Admit: 2020-09-04 | Discharge: 2020-09-04 | Disposition: A | Discharge status: Home / Self Care | Payer: 59 | Source: Ambulatory Visit | Attending: Family Medicine | Admitting: Family Medicine

## 2020-09-04 ENCOUNTER — Other Ambulatory Visit: Payer: Self-pay

## 2020-09-04 DIAGNOSIS — Z1231 Encounter for screening mammogram for malignant neoplasm of breast: Secondary | ICD-10-CM

## 2020-10-07 ENCOUNTER — Other Ambulatory Visit: Payer: Self-pay | Admitting: Cardiology

## 2020-10-07 DIAGNOSIS — E7800 Pure hypercholesterolemia, unspecified: Secondary | ICD-10-CM

## 2020-10-07 NOTE — Telephone Encounter (Signed)
From pt

## 2020-10-10 NOTE — Telephone Encounter (Signed)
From patient.

## 2020-11-18 ENCOUNTER — Other Ambulatory Visit: Payer: Self-pay

## 2020-11-18 ENCOUNTER — Ambulatory Visit (INDEPENDENT_AMBULATORY_CARE_PROVIDER_SITE_OTHER): Payer: Self-pay | Admitting: Plastic Surgery

## 2020-11-18 ENCOUNTER — Encounter: Payer: Self-pay | Admitting: Plastic Surgery

## 2020-11-18 VITALS — BP 107/65

## 2020-11-18 DIAGNOSIS — Z719 Counseling, unspecified: Secondary | ICD-10-CM

## 2020-11-18 NOTE — Progress Notes (Signed)
.  csbot Botulinum Toxin Procedure Note  Procedure: Cosmetic botulinum toxin   Pre-operative Diagnosis: Dynamic rhytides   Post-operative Diagnosis: Same  Complications:  None  Brief history: The patient desires botulinum toxin injection of her forehead. I discussed with the patient this proposed procedure of botulinum toxin injections, which is customized depending on the particular needs of the patient. It is performed on facial rhytids as a temporary correction. The alternatives were discussed with the patient. The risks were addressed including bleeding, scarring, infection, damage to deeper structures, asymmetry, and chronic pain, which may occur infrequently after a procedure. The individual's choice to undergo a surgical procedure is based on the comparison of risks to potential benefits. Other risks include unsatisfactory results, brow ptosis, eyelid ptosis, allergic reaction, temporary paralysis, which should go away with time, bruising, blurring disturbances and delayed healing. Botulinum toxin injections do not arrest the aging process or produce permanent tightening of the eyelid.  Operative intervention maybe necessary to maintain the results of a blepharoplasty or botulinum toxin. The patient understands and wishes to proceed.  Procedure: The area was prepped with alcohol and dried with a clean gauze. Using a clean technique, the botulinum toxin was diluted with 1.25 cc of preservative-free normal saline which was slowly injected with an 18 gauge needle in a tuberculin syringes.  A 32 gauge needles were then used to inject the botulinum toxin. This mixture allow for an aliquot of 5 units per 0.1 cc in each injection site.    Subsequently the mixture was injected in the glabellar and forehead area with preservation of the temporal branch to the lateral eyebrow as well as into each lateral canthal area beginning from the lateral orbital rim medial to the zygomaticus major in 3 separate  areas. A total of 30 Units of botulinum toxin was used. The forehead and glabellar area was injected with care to inject intramuscular only while holding pressure on the supratrochlear vessels in each area during each injection on either side of the medial corrugators. The injection proceeded vertically superiorly to the medial 2/3 of the frontalis muscle and superior 2/3 of the lateral frontalis, again with preservation of the frontal branch.  No complications were noted. Light pressure was held for 5 minutes. She was instructed explicitly in post-operative care.  Botox LOT:  [id number] C2 EXP:  6/24

## 2021-03-25 ENCOUNTER — Other Ambulatory Visit: Payer: Self-pay | Admitting: Nurse Practitioner

## 2021-03-25 DIAGNOSIS — Z79890 Hormone replacement therapy (postmenopausal): Secondary | ICD-10-CM

## 2021-03-26 NOTE — Telephone Encounter (Signed)
RX refill request for Progesterone Upcoming Annual exam 04/20/2021 I will route this to Provider for approval

## 2021-04-02 ENCOUNTER — Other Ambulatory Visit: Payer: Self-pay | Admitting: Nurse Practitioner

## 2021-04-02 DIAGNOSIS — Z79890 Hormone replacement therapy (postmenopausal): Secondary | ICD-10-CM

## 2021-04-02 NOTE — Telephone Encounter (Signed)
Annual exam scheduled on 04/20/21

## 2021-04-15 ENCOUNTER — Other Ambulatory Visit: Payer: Self-pay

## 2021-04-15 ENCOUNTER — Ambulatory Visit: Payer: 59 | Admitting: Nurse Practitioner

## 2021-04-15 ENCOUNTER — Encounter: Payer: Self-pay | Admitting: Nurse Practitioner

## 2021-04-15 VITALS — BP 120/78

## 2021-04-15 DIAGNOSIS — N898 Other specified noninflammatory disorders of vagina: Secondary | ICD-10-CM

## 2021-04-15 DIAGNOSIS — R82998 Other abnormal findings in urine: Secondary | ICD-10-CM

## 2021-04-15 DIAGNOSIS — R300 Dysuria: Secondary | ICD-10-CM | POA: Diagnosis not present

## 2021-04-15 LAB — WET PREP FOR TRICH, YEAST, CLUE

## 2021-04-15 NOTE — Progress Notes (Signed)
   Acute Office Visit  Subjective:    Patient ID: Hayley Stewart, female    DOB: [birth year], 55 y.o.   MRN: [id number]   HPI 55 y.o. presents today for mild dysuria and vaginal irritation. Treated for UTI 04/02/2021 with Bactrim by provider at work. They do not have lab at facility, so no urinalysis obtained. Her symptoms improved but she still feels mild burning with urination. She is going to [facility] in a couple of weeks and wants to rule out recurrent infection.    Review of Systems  Constitutional: Negative.   Genitourinary:  Positive for dysuria. Negative for flank pain, frequency, hematuria, urgency and vaginal discharge.      Objective:    Physical Exam Constitutional:      Appearance: Normal appearance.  Abdominal:     Tenderness: There is no right CVA tenderness or left CVA tenderness.  Genitourinary:    General: Normal vulva.     Vagina: Normal.     Cervix: Normal.    BP 120/78   LMP 08/12/2015  Wt Readings from Last 3 Encounters:  06/16/20 140 lb 4.8 oz (63.6 kg)  04/15/20 142 lb 9.6 oz (64.7 kg)  03/25/20 139 lb (63 kg)   UA negative leukocytes, negative nitrates, blood 1+, SG 1.030, yellow/slightly cloudy. Microscopic: wbc none, rbc 0-2, few bacteria, hyaline casts present  Wet prep negative     Assessment & Plan:   Problem List Items Addressed This Visit   None Visit Diagnoses     Dysuria    -  Primary   Relevant Orders   Urinalysis,Complete w/RFL Culture   Vaginal irritation       Relevant Orders   WET PREP FOR [place], YEAST, CLUE   Casts urinary hyaline       Relevant Orders   Comprehensive metabolic panel   CBC with Differential/Platelet      Plan: Negative wet prep and urinalysis negative for evidence of UTI but did have hyaline casts present. Will check kidney function. She is very active and casts are likely from that.      [name] DNP, 8:55 AM 04/15/2021

## 2021-04-16 LAB — CBC WITH DIFFERENTIAL/PLATELET
Absolute Monocytes: 300 cells/uL (ref 200–950)
Basophils Absolute: 51 cells/uL (ref 0–200)
Basophils Relative: 1.3 %
Eosinophils Absolute: 199 cells/uL (ref 15–500)
Eosinophils Relative: 5.1 %
HCT: 37.6 % (ref 35.0–45.0)
Hemoglobin: 12.7 g/dL (ref 11.7–15.5)
Lymphs Abs: 1041 cells/uL (ref 850–3900)
MCH: 30.8 pg (ref 27.0–33.0)
MCHC: 33.8 g/dL (ref 32.0–36.0)
MCV: 91.3 fL (ref 80.0–100.0)
MPV: 8.9 fL (ref 7.5–12.5)
Monocytes Relative: 7.7 %
Neutro Abs: 2309 cells/uL (ref 1500–7800)
Neutrophils Relative %: 59.2 %
Platelets: 359 10*3/uL (ref 140–400)
RBC: 4.12 10*6/uL (ref 3.80–5.10)
RDW: 11.7 % (ref 11.0–15.0)
Total Lymphocyte: 26.7 %
WBC: 3.9 10*3/uL (ref 3.8–10.8)

## 2021-04-16 LAB — COMPREHENSIVE METABOLIC PANEL
AG Ratio: 1.7 (calc) (ref 1.0–2.5)
ALT: 13 U/L (ref 6–29)
AST: 19 U/L (ref 10–35)
Albumin: 4.5 g/dL (ref 3.6–5.1)
Alkaline phosphatase (APISO): 56 U/L (ref 37–153)
BUN: 15 mg/dL (ref 7–25)
CO2: 28 mmol/L (ref 20–32)
Calcium: 9.1 mg/dL (ref 8.6–10.4)
Chloride: 105 mmol/L (ref 98–110)
Creat: 1 mg/dL (ref 0.50–1.03)
Globulin: 2.7 g/dL (calc) (ref 1.9–3.7)
Glucose, Bld: 96 mg/dL (ref 65–99)
Potassium: 4.4 mmol/L (ref 3.5–5.3)
Sodium: 139 mmol/L (ref 135–146)
Total Bilirubin: 0.5 mg/dL (ref 0.2–1.2)
Total Protein: 7.2 g/dL (ref 6.1–8.1)

## 2021-04-17 LAB — URINE CULTURE
MICRO NUMBER:: 12372426
Result:: NO GROWTH
SPECIMEN QUALITY:: ADEQUATE

## 2021-04-17 LAB — URINALYSIS, COMPLETE W/RFL CULTURE
Bilirubin Urine: NEGATIVE
Crystals: NONE SEEN /HPF
Glucose, UA: NEGATIVE
Ketones, ur: NEGATIVE
Leukocyte Esterase: NEGATIVE
Nitrites, Initial: NEGATIVE
Protein, ur: NEGATIVE
Specific Gravity, Urine: 1.021 (ref 1.001–1.035)
WBC, UA: NONE SEEN /HPF (ref 0–5)
Yeast: NONE SEEN /HPF
pH: 5.5 (ref 5.0–8.0)

## 2021-04-17 LAB — CULTURE INDICATED

## 2021-04-20 ENCOUNTER — Encounter: Payer: Self-pay | Admitting: Nurse Practitioner

## 2021-04-20 ENCOUNTER — Other Ambulatory Visit: Payer: Self-pay

## 2021-04-20 ENCOUNTER — Ambulatory Visit (INDEPENDENT_AMBULATORY_CARE_PROVIDER_SITE_OTHER): Payer: 59 | Admitting: Nurse Practitioner

## 2021-04-20 VITALS — BP 120/78 | Ht 61.0 in | Wt 141.0 lb

## 2021-04-20 DIAGNOSIS — Z01419 Encounter for gynecological examination (general) (routine) without abnormal findings: Secondary | ICD-10-CM | POA: Diagnosis not present

## 2021-04-20 DIAGNOSIS — Z79890 Hormone replacement therapy (postmenopausal): Secondary | ICD-10-CM | POA: Diagnosis not present

## 2021-04-20 NOTE — Progress Notes (Signed)
   Hayley Stewart 1966-02-13 [id number]   History:  55 y.o. G1P1 presents for annual exam. Postmenopausal - on HRT with good relief of hot flashes, no bleeding. She tried to stop HRT in the past but could not tolerate. Normal pap and mammogram history. Thyroidectomy around 10 years ago, managed by endocrinology.   Gynecologic History Contraception: post menopausal status  Health Maintenance Last Pap: 05/24/2017. Results were: Normal, 5-year repeat Last mammogram: 09/04/2020. Results were: Normal Last colonoscopy: 11/01/2016. Results were: polyp, 5-year recall Last Dexa: Not indicated  Past medical history, past surgical history, family history and social history were all reviewed and documented in the EPIC chart. Married. 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 76 yo daughter.   ROS:  A ROS was performed and pertinent positives and negatives are included.  Exam:  Vitals:   04/20/21 1048  BP: 120/78  Weight: 141 lb (64 kg)  Height: '5\' 1"'$  (1.549 m)    Body mass index is 26.64 kg/m.  General appearance:  Normal Thyroid:  Symmetrical, normal in size, without palpable masses or nodularity. Respiratory  Auscultation:  Clear without wheezing or rhonchi Cardiovascular  Auscultation:  Regular rate, without rubs, murmurs or gallops  Edema/varicosities:  Not grossly evident Abdominal  Soft,nontender, without masses, guarding or rebound.  Liver/spleen:  No organomegaly noted  Hernia:  None appreciated  Skin  Inspection:  Grossly normal   Breasts: Examined lying and sitting.   Right: Without masses, retractions, discharge or axillary adenopathy.   Left: Without masses, retractions, discharge or axillary adenopathy. Gentitourinary   Inguinal/mons:  Normal without inguinal adenopathy  External genitalia:  Normal  BUS/Urethra/Skene's glands:  Normal  Vagina:  Normal  Cervix:  Normal  Uterus:  Normal in size, shape and contour.  Midline and mobile  Adnexa/parametria:      Rt: Without masses or tenderness.   Lt: Without masses or tenderness.  Anus and perineum: Normal  Digital rectal exam: Normal sphincter tone without palpated masses or tenderness  Patient informed chaperone available to be present for breast and pelvic exam. Patient has requested no chaperone to be present. Patient has been advised what will be completed during breast and pelvic exam.   Assessment/Plan:  55 y.o. G1P1 for annual exam.   Well female exam with routine gynecological exam - Education provided on SBEs, importance of preventative screenings, current guidelines, high calcium diet, regular exercise, and multivitamin daily.  Labs with endocrinology and PCP. Recent CBC, CMP here.   Hormone replacement therapy (HRT) - Estradiol 0.5 mg tablet and Prometrium 100 mg.Has tried to wean in the past but could not tolerate. She is aware of the benefits for heart and bone health, and of course symptom management, as well as the risks for blood clots, heart attack, stroke, and breast cancer.   Screening for cervical cancer - Normal Pap history.  Will repeat at 5-year interval per guidelines.  Screening for breast cancer - Normal mammogram history.  Continue annual screenings.  Normal breast exam today.  Screening for colon cancer - 2018 colonoscopy. Will repeat at GI's recommended interval.   Screening for osteoporosis - Will plan Dexa further into menopause.   Follow up in 1 year for annual.    [name] [facility], 11:07 AM 04/20/2021

## 2021-04-21 ENCOUNTER — Encounter: Payer: 59 | Admitting: Plastic Surgery

## 2021-05-01 ENCOUNTER — Other Ambulatory Visit: Payer: Self-pay

## 2021-05-01 ENCOUNTER — Encounter: Payer: Self-pay | Admitting: Plastic Surgery

## 2021-05-01 ENCOUNTER — Ambulatory Visit (INDEPENDENT_AMBULATORY_CARE_PROVIDER_SITE_OTHER): Payer: Self-pay | Admitting: Plastic Surgery

## 2021-05-01 DIAGNOSIS — Z719 Counseling, unspecified: Secondary | ICD-10-CM

## 2021-05-01 NOTE — Progress Notes (Signed)
Botulinum Toxin Injection Procedure Note  Procedure: Cosmetic botulinum toxin   Pre-operative Diagnosis: Dynamic rhytides   Post-operative Diagnosis: Same  Complications:  None  Brief history: The patient desires botulinum toxin injection of her forehead. I discussed with the patient this proposed procedure of botulinum toxin injections, which is customized depending on the particular needs of the patient. It is performed on facial rhytids as a temporary correction. The alternatives were discussed with the patient. The risks were addressed including bleeding, scarring, infection, damage to deeper structures, asymmetry, and chronic pain, which may occur infrequently after a procedure. The individual's choice to undergo a surgical procedure is based on the comparison of risks to potential benefits. Other risks include unsatisfactory results, brow ptosis, eyelid ptosis, allergic reaction, temporary paralysis, which should go away with time, bruising, blurring disturbances and delayed healing. Botulinum toxin injections do not arrest the aging process or produce permanent tightening of the eyelid.  Operative intervention maybe necessary to maintain the results of a blepharoplasty or botulinum toxin. The patient understands and wishes to proceed.  Procedure: The area was prepped with alcohol and dried with a clean gauze. Using a clean technique, the botulinum toxin was diluted with 1.25 cc of preservative-free normal saline which was slowly injected with an 18 gauge needle in a tuberculin syringes.  A 32 gauge needles were then used to inject the botulinum toxin. This mixture allow for an aliquot of 5 units per 0.1 cc in each injection site.    Subsequently the mixture was injected in the glabellar and forehead area with preservation of the temporal branch to the lateral eyebrow as well as into each lateral canthal area beginning from the lateral orbital rim medial to the zygomaticus major in 3 separate  areas. A total of 30 Units of botulinum toxin was used. The forehead and glabellar area was injected with care to inject intramuscular only while holding pressure on the supratrochlear vessels in each area during each injection on either side of the medial corrugators. The injection proceeded vertically superiorly to the medial 2/3 of the frontalis muscle and superior 2/3 of the lateral frontalis, again with preservation of the frontal branch.  No complications were noted. Light pressure was held for 5 minutes. She was instructed explicitly in post-operative care.  Botox LOT:  [id number] C4 EXP:  7/24

## 2021-06-15 ENCOUNTER — Ambulatory Visit: Payer: 59 | Admitting: Cardiology

## 2021-06-17 LAB — LIPID PANEL WITH LDL/HDL RATIO
Cholesterol, Total: 191 mg/dL (ref 100–199)
HDL: 58 mg/dL (ref >39)
LDL Chol Calc (NIH): 114 mg/dL — ABNORMAL HIGH (ref 0–99)
LDL/HDL Ratio: 2 ratio (ref 0.0–3.2)
Triglycerides: 108 mg/dL (ref 0–149)
VLDL Cholesterol Cal: 19 mg/dL (ref 5–40)

## 2021-06-17 NOTE — Progress Notes (Signed)
LDL is elevated after reduction of rosuvastatin from 20 to 10 mg, will discuss with the patient's visit soon regarding starting Lipitor versus adding Zetia.  Patient got myalgias with 20 mg of rosuvastatin.

## 2021-06-17 NOTE — Telephone Encounter (Signed)
From patient.

## 2021-06-18 ENCOUNTER — Other Ambulatory Visit: Payer: Self-pay | Admitting: Nurse Practitioner

## 2021-06-18 DIAGNOSIS — Z79890 Hormone replacement therapy (postmenopausal): Secondary | ICD-10-CM

## 2021-06-19 NOTE — Telephone Encounter (Signed)
Rx refill request for progesterone 100mg  for a year supply. Next annual due 04/2022. Rx placed for progesterone 100mg  #90 w/ 3RF. Tg

## 2021-06-22 ENCOUNTER — Other Ambulatory Visit: Payer: Self-pay

## 2021-06-22 ENCOUNTER — Encounter: Payer: Self-pay | Admitting: Cardiology

## 2021-06-22 ENCOUNTER — Ambulatory Visit: Payer: 59 | Admitting: Cardiology

## 2021-06-22 VITALS — BP 128/80 | HR 92 | Temp 98.0°F | Resp 16 | Ht 61.0 in | Wt 146.0 lb

## 2021-06-22 DIAGNOSIS — I6523 Occlusion and stenosis of bilateral carotid arteries: Secondary | ICD-10-CM

## 2021-06-22 DIAGNOSIS — E7800 Pure hypercholesterolemia, unspecified: Secondary | ICD-10-CM

## 2021-06-22 DIAGNOSIS — R931 Abnormal findings on diagnostic imaging of heart and coronary circulation: Secondary | ICD-10-CM

## 2021-06-22 MED ORDER — EZETIMIBE 10 MG PO TABS: 10.0000 mg | ORAL_TABLET | Freq: Every day | ORAL | 3 refills | Status: DC

## 2021-06-22 NOTE — Progress Notes (Signed)
Primary Physician/Referring:  Glenis Smoker, MD  Patient ID: Hayley Stewart, female    DOB: 1966/02/22, 55 y.o.   MRN: [id number]  Chief Complaint  Patient presents with   Hyperlipidemia   Follow-up   HPI:    Hayley Stewart  is a 55 y.o. Caucasian female with no significant personal cardiac history, her father had carotid disease in his early 14s.  And needed carotid artery stenting and surgery.  Her maternal grandfather had multiple cardiac revascularization procedures in his 25s, told to have mild hyperlipidemia, has partial thyroidectomy and presently on thyroid supplements.   Patient was originally referred to our office for cardiac risk evaluation.  She subsequently underwent coronary calcium score which noted to be in the >90th percentile for age and sex, she underwent nuclear stress testing which was overall low risk.  Recommend aggressive risk factor modification.  Patient remains very active, doing exercise classes regularly and mountain biking and hiking frequently.  Patient does report continued mild muscle aches and pains as well as fatigue, particularly noticing this during exercise classes.  She is otherwise asymptomatic.  Patient has not tolerated atorvastatin in the past due to diarrhea.  Unable to tolerate higher doses of rosuvastatin due to worsening myalgias.  Past Medical History:  Diagnosis Date   Adenomatous colon polyp 10/2016   Elevated cholesterol    Laryngopharyngeal reflux    Dr. Redmond Baseman (ENT)   Osteoporosis 2004   Thyroid disease    Past Surgical History:  Procedure Laterality Date   BREAST BIOPSY Right 08/20/2003   BREAST EXCISIONAL BIOPSY Right 08/07/2001   BREAST SURGERY     right breast biopsy/FIBROMA OF RT BREAST   THYROIDECTOMY, PARTIAL  09/2009   R lobectomy/BENIGN   Family History  Problem Relation Age of Onset   Hypertension Mother    Hyperlipidemia Mother    Osteoporosis Mother    Hypertension Father    Heart disease Father     Osteoporosis Maternal Aunt    Osteoporosis Maternal Aunt    Colon cancer Paternal Uncle    Breast cancer Maternal Grandmother    Heart disease Maternal Grandfather    Esophageal cancer Neg Hx    Rectal cancer Neg Hx    Stomach cancer Neg Hx     Social History   Tobacco Use   Smoking status: Never   Smokeless tobacco: Never  Substance Use Topics   Alcohol use: Yes    Comment: 1 drink per month.   Marital Status: Married   ROS  Review of Systems  Cardiovascular:  Negative for chest pain, claudication, dyspnea on exertion, leg swelling, near-syncope, orthopnea, palpitations, paroxysmal nocturnal dyspnea and syncope.  Respiratory:  Negative for shortness of breath.   Gastrointestinal:  Negative for melena.  Neurological:  Negative for dizziness.   Objective  Blood pressure 128/80, pulse 92, temperature 98 F (36.7 C), resp. rate 16, height 5\' 1"  (1.549 m), weight 146 lb (66.2 kg), last menstrual period 08/12/2015, SpO2 98 %.  Vitals with BMI 06/22/2021 04/20/2021 04/15/2021  Height 5\' 1"  5\' 1"  -  Weight 146 lbs 141 lbs -  BMI 11.9 41.74 -  Systolic 081 448 185  Diastolic 80 78 78  Pulse 92 - -     Physical Exam Vitals reviewed.  Cardiovascular:     Rate and Rhythm: Normal rate and regular rhythm.     Pulses: Normal pulses and intact distal pulses.          Carotid pulses are  on the left side with bruit.    Heart sounds: S1 normal and S2 normal. A midsystolic click. Murmur heard.  Mid to late systolic murmur is present with a grade of 1/6 at the apex.    No gallop.     Comments: No JVD.  Pulmonary:     Effort: Pulmonary effort is normal.     Breath sounds: Normal breath sounds.  Musculoskeletal:     Right lower leg: No edema.     Left lower leg: No edema.  Physical exam remains unchanged compared to previous office visit.  Laboratory examination:   Lipid Panel Recent Labs    06/16/21 1145  CHOL 191  TRIG 108  LDLCALC 114*  HDL 58    External labs:   12/24/2019:  HDL 61, LDL 163, triglycerides 95, total cholesterol 240.  NHDL Chol 179 BUN 17, serum creatinine 0.99 Glucose 100  06/05/2019: TSH 1.310  Allergies   Allergies  Allergen Reactions   Lipitor [Atorvastatin] Other (See Comments)    Myalgia and arthralgia   Nitrofurantoin Monohyd Macro Nausea And Vomiting   Penicillins Other (See Comments)    Historical from mother.      Medications Prior to Visit:   Outpatient Medications Prior to Visit  Medication Sig Dispense Refill   aspirin (ASPIRIN CHILDRENS) 81 MG chewable tablet Chew 1 tablet (81 mg total) by mouth daily.     estradiol (ESTRACE) 0.5 MG tablet TAKE 1 TABLET BY MOUTH  DAILY 90 tablet 0   levothyroxine (SYNTHROID) 75 MCG tablet Take 1 tablet (75 mcg total) by mouth daily. Mon-Sat 90 tablet 4   Multiple Vitamins-Minerals (MULTIVITAMIN WITH MINERALS) tablet Take 1 tablet by mouth daily.     progesterone (PROMETRIUM) 100 MG capsule TAKE 1 CAPSULE BY MOUTH  DAILY 90 capsule 3   rosuvastatin (CRESTOR) 20 MG tablet Take 1 tablet (20 mg total) by mouth daily. (Patient taking differently: Take 10 mg by mouth daily.) 90 tablet 1   No facility-administered medications prior to visit.   Final Medications at End of Visit    Current Meds  Medication Sig   aspirin (ASPIRIN CHILDRENS) 81 MG chewable tablet Chew 1 tablet (81 mg total) by mouth daily.   estradiol (ESTRACE) 0.5 MG tablet TAKE 1 TABLET BY MOUTH  DAILY   ezetimibe (ZETIA) 10 MG tablet Take 1 tablet (10 mg total) by mouth daily.   levothyroxine (SYNTHROID) 75 MCG tablet Take 1 tablet (75 mcg total) by mouth daily. Mon-Sat   Multiple Vitamins-Minerals (MULTIVITAMIN WITH MINERALS) tablet Take 1 tablet by mouth daily.   progesterone (PROMETRIUM) 100 MG capsule TAKE 1 CAPSULE BY MOUTH  DAILY   rosuvastatin (CRESTOR) 20 MG tablet Take 1 tablet (20 mg total) by mouth daily. (Patient taking differently: Take 10 mg by mouth daily.)   Radiology:   No results  found.  Cardiac Studies:   Coronary calcium score 05/05/2020: Total calcium score 222.  Which is between 90th 100 percentile for a female patient this age. LAD 108, circumflex 0, RCA 114.  Small hiatal hernia.  Carotid artery duplex  04/21/2020: Mild heterogenous plaque in bilateral internal carotid arteries without hemodynamically significant stenosis.  Stenosis in the right external carotid artery (<50%). Antegrade right vertebral artery flow. Antegrade left vertebral artery flow. Follow up is appropriate if clinically indicated.  Exercise treadmill stress test 04/23/2020: Exercise treadmill stress test performed using Bruce protocol.  Patient reached 8.8 METS, and 105% of age predicted maximum heart rate.  Exercise capacity was  fair.  No chest pain reported.  Normal heart rate and hemodynamic response. Stress EKG revealed no ischemic changes. Low risk study.  Echocardiogram 06/30/2020:  Left ventricle cavity is normal in size and wall thickness. Normal global  wall motion. Normal LV systolic function with EF 59%. Normal diastolic  filling pattern.  Aneurysmal interatrial septum without 2D or color Doppler evidence of  interatrial shunt.  Mild (Grade I) mitral regurgitation.  Mild tricuspid regurgitation.  No evidence of pulmonary hypertension.  EKG   06/22/2021: Sinus rhythm at a rate of 73 bpm.  Normal axis.  No evidence of ischemia or underlying injury pattern.  Compared to EKG 04/15/2020, no significant change.  EKG 04/15/2020: Normal sinus rhythm at rate of 88 bpm, normal axis.  No evidence of ischemia, normal EKG.      Assessment     ICD-10-CM   1. Agatston coronary artery calcium score between 200 and 399  R93.1 EKG 12-Lead    Lipid Panel With LDL/HDL Ratio    Lipid Panel With LDL/HDL Ratio    2. Hypercholesteremia  E78.00     3. Atherosclerosis of both carotid arteries  I65.23        There are no discontinued medications.   Meds ordered this encounter   Medications   ezetimibe (ZETIA) 10 MG tablet    Sig: Take 1 tablet (10 mg total) by mouth daily.    Dispense:  90 tablet    Refill:  3   Recommendations:   Hayley Stewart is a 55 y.o. Caucasian female with no significant personal cardiac history, her father had carotid disease in his early 26s.  And needed carotid artery stenting and surgery.  Her maternal grandfather had multiple cardiac revascularization procedures in his 58s, told to have mild hyperlipidemia, has partial thyroidectomy and presently on thyroid supplements.   Patient originally referred for cardiac risk stratification, subsequent investigations noted heterogeneous plaque in bilateral carotid arteries without significant stenosis and moderately elevated coronary calcium score in the 90th percentile.  Patient was therefore started on aspirin and statin therapy.  She has been unable to tolerate higher doses of atorvastatin or rosuvastatin.  She is tolerating rosuvastatin 10 mg daily relatively well.  However repeat lipid profile testing shows LDL remains uncontrolled at 114.  She decision was to add Zetia 10 mg daily and repeat lipid profile testing.  Patient is willing to consider up titration of statin therapy if LDL remains uncontrolled.   Also reviewed and discussed results of echocardiogram, details above.  Echo noted preserved LVEF with mild valvular disease, otherwise no significant abnormalities.  Follow up in 3 months, sooner if needed, for hyperlipidemia.    Alethia Berthold, PA-C 06/22/2021, 3:49 PM Office: [phone]

## 2021-06-25 ENCOUNTER — Other Ambulatory Visit: Payer: Self-pay | Admitting: Nurse Practitioner

## 2021-06-25 DIAGNOSIS — Z79890 Hormone replacement therapy (postmenopausal): Secondary | ICD-10-CM

## 2021-06-29 NOTE — Telephone Encounter (Signed)
Last annual exam on 9/22

## 2021-06-30 ENCOUNTER — Ambulatory Visit: Payer: 59 | Admitting: Nurse Practitioner

## 2021-06-30 ENCOUNTER — Other Ambulatory Visit: Payer: Self-pay

## 2021-06-30 VITALS — BP 122/82 | HR 88

## 2021-06-30 DIAGNOSIS — N3000 Acute cystitis without hematuria: Secondary | ICD-10-CM | POA: Diagnosis not present

## 2021-06-30 DIAGNOSIS — R300 Dysuria: Secondary | ICD-10-CM

## 2021-06-30 MED ORDER — SULFAMETHOXAZOLE-TRIMETHOPRIM 800-160 MG PO TABS: 1.0000 | ORAL_TABLET | Freq: Two times a day (BID) | ORAL | 0 refills | Status: AC

## 2021-06-30 NOTE — Progress Notes (Signed)
   Acute Office Visit  Subjective:    Patient ID: Hayley Stewart, female    DOB: 1966/05/28, 55 y.o.   MRN: [id number]   HPI 55 y.o. presents today for urinary urgency, frequency, and lower abdominal pressure.    Review of Systems  Constitutional: Negative.   Genitourinary:  Positive for dysuria, frequency, pelvic pain (Pressure) and urgency. Negative for difficulty urinating, flank pain and hematuria.      Objective:    Physical Exam Constitutional:      Appearance: Normal appearance.  Abdominal:     Tenderness: There is no right CVA tenderness or left CVA tenderness.  GU: Not indicated  BP 122/82   Pulse 88   LMP 08/12/2015   SpO2 96%  Wt Readings from Last 3 Encounters:  06/22/21 146 lb (66.2 kg)  04/20/21 141 lb (64 kg)  06/16/20 140 lb 4.8 oz (63.6 kg)   UA microscopic: wbc 10-20, moderate bacteria, no rbc, squ epith 6-10     Assessment & Plan:   Problem List Items Addressed This Visit   None Visit Diagnoses     Acute cystitis without hematuria    -  Primary   Relevant Medications   sulfamethoxazole-trimethoprim (BACTRIM DS) 800-160 MG tablet   Dysuria       Relevant Orders   Urinalysis,Complete w/RFL Culture (Completed)      Plan: Bactrim 800-160 mg BID x 3 days. Culture pending. Increase water intake.      [name] DNP, 4:38 PM 06/30/2021

## 2021-07-03 LAB — URINE CULTURE
MICRO NUMBER:: 12689785
SPECIMEN QUALITY:: ADEQUATE

## 2021-07-03 LAB — CULTURE INDICATED

## 2021-07-03 LAB — URINALYSIS, COMPLETE W/RFL CULTURE
Hyaline Cast: NONE SEEN /LPF
RBC / HPF: NONE SEEN /HPF (ref 0–2)

## 2021-07-06 ENCOUNTER — Encounter: Payer: Self-pay | Admitting: Cardiology

## 2021-07-07 ENCOUNTER — Other Ambulatory Visit: Payer: Self-pay

## 2021-07-07 MED ORDER — EZETIMIBE 10 MG PO TABS: 10.0000 mg | ORAL_TABLET | Freq: Every day | ORAL | 3 refills | Status: DC

## 2021-07-29 ENCOUNTER — Other Ambulatory Visit: Payer: Self-pay | Admitting: Family Medicine

## 2021-07-29 DIAGNOSIS — Z1231 Encounter for screening mammogram for malignant neoplasm of breast: Secondary | ICD-10-CM

## 2021-09-07 ENCOUNTER — Ambulatory Visit
Admission: RE | Admit: 2021-09-07 | Discharge: 2021-09-07 | Disposition: A | Discharge status: Home / Self Care | Payer: 59 | Source: Ambulatory Visit | Attending: Family Medicine | Admitting: Family Medicine

## 2021-09-07 ENCOUNTER — Other Ambulatory Visit: Payer: Self-pay

## 2021-09-07 DIAGNOSIS — Z1231 Encounter for screening mammogram for malignant neoplasm of breast: Secondary | ICD-10-CM

## 2021-09-22 ENCOUNTER — Ambulatory Visit: Payer: 59 | Admitting: Student

## 2021-10-06 ENCOUNTER — Encounter: Payer: 59 | Admitting: Plastic Surgery

## 2021-10-20 ENCOUNTER — Ambulatory Visit: Payer: 59 | Admitting: Student

## 2021-11-20 ENCOUNTER — Encounter: Payer: 59 | Admitting: Internal Medicine

## 2021-11-25 ENCOUNTER — Ambulatory Visit (AMBULATORY_SURGERY_CENTER): Payer: 59

## 2021-11-25 VITALS — Ht 61.0 in | Wt 140.0 lb

## 2021-11-25 DIAGNOSIS — Z86010 Personal history of colonic polyps: Secondary | ICD-10-CM

## 2021-11-25 MED ORDER — NA SULFATE-K SULFATE-MG SULF 17.5-3.13-1.6 GM/177ML PO SOLN: 1.0000 | Freq: Once | ORAL | 0 refills | Status: AC

## 2021-11-25 NOTE — Progress Notes (Signed)
No egg or soy allergy known to patient  ?No issues known to pt with past sedation with any surgeries or procedures ?Patient denies ever being told they had issues or difficulty with intubation  ?No FH of Malignant Hyperthermia ?Pt is not on diet pills ?Pt is not on  home 02  ?Pt is not on blood thinners  ?Pt denies issues with constipation  ?No A fib or A flutter ? ? ?NO PA's for preps discussed with pt In PV today  ?Discussed with pt there will be an out-of-pocket cost for prep and that varies from $0 to 70 +  dollars - pt verbalized understanding  ?Pt instructed to use Singlecare.com or GoodRx for a price reduction on prep  ? ?PV completed over the phone. Pt verified name, DOB, address and insurance during PV today.  ?Pt mailed instruction packet with copy of consent form to read and not return, and instructions.  ?Pt encouraged to call with questions or issues.  ?If pt has My chart, procedure instructions also sent via My Chart  ?Insurance confirmed with pt at [facility] today  ? ?

## 2021-11-27 ENCOUNTER — Encounter: Payer: Self-pay | Admitting: Internal Medicine

## 2021-12-09 ENCOUNTER — Encounter: Payer: Self-pay | Admitting: Internal Medicine

## 2021-12-09 ENCOUNTER — Ambulatory Visit (AMBULATORY_SURGERY_CENTER): Payer: 59 | Admitting: Internal Medicine

## 2021-12-09 VITALS — BP 108/67 | HR 76 | Temp 97.7°F | Resp 14 | Ht 61.0 in | Wt 140.0 lb

## 2021-12-09 DIAGNOSIS — Z86010 Personal history of colonic polyps: Secondary | ICD-10-CM | POA: Diagnosis present

## 2021-12-09 MED ORDER — SODIUM CHLORIDE 0.9 % IV SOLN: 500.0000 mL | Freq: Once | INTRAVENOUS | Status: DC

## 2021-12-09 NOTE — Patient Instructions (Signed)
No polyps!  Next colonoscopy- 10 years! ? ?Please read handout about diverticulosis ? ?Continue your normal medications ? ? ?YOU HAD AN ENDOSCOPIC PROCEDURE TODAY AT [facility]:   Refer to the procedure report that was given to you for any specific questions about what was found during the examination.  If the procedure report does not answer your questions, please call your gastroenterologist to clarify.  If you requested that your care partner not be given the details of your procedure findings, then the procedure report has been included in a sealed envelope for you to review at your convenience later. ? ?YOU SHOULD EXPECT: Some feelings of bloating in the abdomen. Passage of more gas than usual.  Walking can help get rid of the air that was put into your GI tract during the procedure and reduce the bloating. If you had a lower endoscopy (such as a colonoscopy or flexible sigmoidoscopy) you may notice spotting of blood in your stool or on the toilet paper. If you underwent a bowel prep for your procedure, you may not have a normal bowel movement for a few days. ? ?Please Note:  You might notice some irritation and congestion in your nose or some drainage.  This is from the oxygen used during your procedure.  There is no need for concern and it should clear up in a day or so. ? ?SYMPTOMS TO REPORT IMMEDIATELY: ? ?Following lower endoscopy (colonoscopy or flexible sigmoidoscopy): ? Excessive amounts of blood in the stool ? Significant tenderness or worsening of abdominal pains ? Swelling of the abdomen that is new, acute ? Fever of 100?F or higher ? ?For urgent or emergent issues, a gastroenterologist can be reached at any hour by calling [phone]. ?Do not use MyChart messaging for urgent concerns.  ? ? ?DIET:  We do recommend a small meal at first, but then you may proceed to your regular diet.  Drink plenty of fluids but you should avoid alcoholic beverages for 24 hours. ? ?ACTIVITY:  You  should plan to take it easy for the rest of today and you should NOT DRIVE or use heavy machinery until tomorrow (because of the sedation medicines used during the test).   ? ?FOLLOW UP: ?Our staff will call the number listed on your records 48-72 hours following your procedure to check on you and address any questions or concerns that you may have regarding the information given to you following your procedure. If we do not reach you, we will leave a message.  We will attempt to reach you two times.  During this call, we will ask if you have developed any symptoms of COVID 19. If you develop any symptoms (ie: fever, flu-like symptoms, shortness of breath, cough etc.) before then, please call [phone].  If you test positive for Covid 19 in the 2 weeks post procedure, please call and report this information to [place].   ? ? ?SIGNATURES/CONFIDENTIALITY: ?You and/or your care partner have signed paperwork which will be entered into your electronic medical record.  These signatures attest to the fact that that the information above on your After Visit Summary has been reviewed and is understood.  Full responsibility of the confidentiality of this discharge information lies with you and/or your care-partner. ? ?

## 2021-12-09 NOTE — Progress Notes (Signed)
? ?GASTROENTEROLOGY PROCEDURE H&P NOTE  ? ?Primary Care Physician: ?Glenis Smoker, MD ? ? ? ?Reason for Procedure:   Hx SSP ? ?Plan:    colonoscopy ? ?Patient is appropriate for endoscopic procedure(s) in the ambulatory ([facility]) setting. ? ?The nature of the procedure, as well as the risks, benefits, and alternatives were carefully and thoroughly reviewed with the patient. Ample time for discussion and questions allowed. The patient understood, was satisfied, and agreed to proceed.  ? ? ? ?HPI: ?Hayley Stewart is a 56 y.o. female who presents for surveillance colonoscopy.  Medical history as below.  Tolerated the prep.  No recent chest pain or shortness of breath.  No abdominal pain today. ? ?Past Medical History:  ?Diagnosis Date  ? Adenomatous colon polyp 10/2016  ? Elevated cholesterol   ? Laryngopharyngeal reflux   ? Dr. Redmond Baseman (ENT)  ? Thyroid disease   ? ? ?Past Surgical History:  ?Procedure Laterality Date  ? BREAST BIOPSY Right 08/20/2003  ? BREAST EXCISIONAL BIOPSY Right 08/07/2001  ? BREAST SURGERY    ? right breast biopsy/FIBROMA OF RT BREAST  ? COLONOSCOPY  11/01/2016  ? THYROIDECTOMY, PARTIAL  09/2009  ? R lobectomy/BENIGN  ? ? ?Prior to Admission medications   ?Medication Sig Start Date End Date Taking? Authorizing Provider  ?estradiol (ESTRACE) 0.5 MG tablet TAKE 1 TABLET BY MOUTH  DAILY 06/29/21  Yes Marny Lowenstein A, NP  ?levothyroxine (SYNTHROID) 75 MCG tablet Take 1 tablet (75 mcg total) by mouth daily. Mon-Sat 11/27/18  Yes [name], NP  ?progesterone (PROMETRIUM) 100 MG capsule TAKE 1 CAPSULE BY MOUTH  DAILY 06/22/21  Yes Marny Lowenstein A, NP  ?aspirin (ASPIRIN CHILDRENS) 81 MG chewable tablet Chew 1 tablet (81 mg total) by mouth daily. ?Patient not taking: Reported on 11/25/2021 06/16/20   Adrian Prows, MD  ?ezetimibe (ZETIA) 10 MG tablet Take 1 tablet (10 mg total) by mouth daily. ?Patient not taking: Reported on 11/25/2021 07/07/21 07/02/22  Alethia Berthold, PA-C  ?Multiple  Vitamins-Minerals (MULTIVITAMIN WITH MINERALS) tablet Take 1 tablet by mouth daily.    [provider]  ?omeprazole (PRILOSEC) 20 MG capsule Take 20 mg by mouth as needed.    [provider]  ?phenazopyridine (PYRIDIUM) 95 MG tablet Take 95 mg by mouth 3 (three) times daily as needed for pain. ?Patient not taking: Reported on 11/25/2021    [provider]  ?rosuvastatin (CRESTOR) 20 MG tablet Take 1 tablet (20 mg total) by mouth daily. ?Patient not taking: Reported on 12/09/2021 08/27/20 12/09/21  Adrian Prows, MD  ? ? ?Current Outpatient Medications  ?Medication Sig Dispense Refill  ? estradiol (ESTRACE) 0.5 MG tablet TAKE 1 TABLET BY MOUTH  DAILY 90 tablet 3  ? levothyroxine (SYNTHROID) 75 MCG tablet Take 1 tablet (75 mcg total) by mouth daily. Mon-Sat 90 tablet 4  ? progesterone (PROMETRIUM) 100 MG capsule TAKE 1 CAPSULE BY MOUTH  DAILY 90 capsule 3  ? aspirin (ASPIRIN CHILDRENS) 81 MG chewable tablet Chew 1 tablet (81 mg total) by mouth daily. (Patient not taking: Reported on 11/25/2021)    ? ezetimibe (ZETIA) 10 MG tablet Take 1 tablet (10 mg total) by mouth daily. (Patient not taking: Reported on 11/25/2021) 90 tablet 3  ? Multiple Vitamins-Minerals (MULTIVITAMIN WITH MINERALS) tablet Take 1 tablet by mouth daily.    ? omeprazole (PRILOSEC) 20 MG capsule Take 20 mg by mouth as needed.    ? phenazopyridine (PYRIDIUM) 95 MG tablet Take 95 mg by  mouth 3 (three) times daily as needed for pain. (Patient not taking: Reported on 11/25/2021)    ? rosuvastatin (CRESTOR) 20 MG tablet Take 1 tablet (20 mg total) by mouth daily. (Patient not taking: Reported on 12/09/2021) 90 tablet 1  ? ?Current Facility-Administered Medications  ?Medication Dose Route Frequency Provider Last Rate Last Admin  ? 0.9 %  sodium chloride infusion  500 mL Intravenous Once Rodert Hinch, Lajuan Lines, MD      ? ? ?Allergies as of 12/09/2021 - Review Complete 12/09/2021  ?Allergen Reaction Noted  ? Lipitor [atorvastatin] Other (See Comments)  08/27/2020  ? Nitrofurantoin monohyd macro Nausea And Vomiting 05/31/2011  ? Penicillins Other (See Comments) 05/31/2011  ? ? ?Family History  ?Problem Relation Age of Onset  ? Hypertension Mother   ? Hyperlipidemia Mother   ? Osteoporosis Mother   ? Hypertension Father   ? Heart disease Father   ? Osteoporosis Maternal Aunt   ? Osteoporosis Maternal Aunt   ? Colon cancer Paternal Uncle   ? Breast cancer Maternal Grandmother   ? Heart disease Maternal Grandfather   ? Esophageal cancer Neg Hx   ? Rectal cancer Neg Hx   ? Stomach cancer Neg Hx   ? Colon polyps Neg Hx   ? ? ?Social History  ? ?Socioeconomic History  ? Marital status: Married  ?  Spouse name: Not on file  ? Number of children: 1  ? Years of education: Not on file  ? Highest education level: Not on file  ?Occupational History  ? Not on file  ?Tobacco Use  ? Smoking status: Never  ?  Passive exposure: Past (childhood until age 45, father smoker)  ? Smokeless tobacco: Never  ?Vaping Use  ? Vaping Use: Never used  ?Substance and Sexual Activity  ? Alcohol use: Not Currently  ?  Comment: 1 drink per month.  ? Drug use: Never  ? Sexual activity: Yes  ?  Partners: Male  ?  Birth control/protection: Other-see comments, Post-menopausal  ?  Comment: husband  vasectomy-1st intercourse 86 yo-1 partner  ?Other Topics Concern  ? Not on file  ?Social History Narrative  ? Not on file  ? ?Social Determinants of Health  ? ?Financial Resource Strain: Not on file  ?Food Insecurity: Not on file  ?Transportation Needs: Not on file  ?Physical Activity: Not on file  ?Stress: Not on file  ?Social Connections: Not on file  ?Intimate Partner Violence: Not on file  ? ? ?Physical Exam: ?Vital signs in last 24 hours: ?'@BP'$  137/88   Pulse 97   Temp 97.7 ?F (36.5 ?C)   Ht '5\' 1"'$  (1.549 m)   Wt 140 lb (63.5 kg)   LMP 08/12/2015   SpO2 100%   BMI 26.45 kg/m?  ?GEN: NAD ?EYE: [place] anicteric ?ENT: MMM ?CV: Non-tachycardic ?Pulm: CTA b/l ?GI: Soft, NT/ND ?NEURO:  Alert & Oriented x  3 ? ? ?Zenovia Jarred, MD ?[facility] ? ?12/09/2021 8:48 AM ? ?

## 2021-12-09 NOTE — Progress Notes (Signed)
Pt's states no medical or surgical changes since previsit or office visit. 

## 2021-12-09 NOTE — Op Note (Signed)
[place] ?Patient Name: Hayley Stewart ?Procedure Date: 12/09/2021 8:46 AM ?MRN: [id number] ?Endoscopist: Jerene Bears , MD ?Age: 56 ?Referring MD:  ?Date of Birth: [birth year] ?Gender: Female ?Account #: [id number] ?Procedure:                Colonoscopy ?Indications:              High risk colon cancer surveillance: Personal  ?                          history of sessile serrated colon polyp (less than  ?                          10 mm in size) with no dysplasia, Last colonoscopy:  ?                          April 2018 ?Medicines:                Monitored Anesthesia Care ?Procedure:                Pre-Anesthesia Assessment: ?                          - Prior to the procedure, a History and Physical  ?                          was performed, and patient medications and  ?                          allergies were reviewed. The patient's tolerance of  ?                          previous anesthesia was also reviewed. The risks  ?                          and benefits of the procedure and the sedation  ?                          options and risks were discussed with the patient.  ?                          All questions were answered, and informed consent  ?                          was obtained. Prior Anticoagulants: The patient has  ?                          taken no previous anticoagulant or antiplatelet  ?                          agents. ASA Grade Assessment: II - A patient with  ?                          mild systemic disease. After reviewing the risks  ?  and benefits, the patient was deemed in  ?                          satisfactory condition to undergo the procedure. ?                          After obtaining informed consent, the colonoscope  ?                          was passed under direct vision. Throughout the  ?                          procedure, the patient's blood pressure, pulse, and  ?                          oxygen saturations were monitored continuously. The  ?                           Olympus [id number]-[id number] (#[id number]) Colonoscope was  ?                          introduced through the anus and advanced to the  ?                          cecum, identified by appendiceal orifice and  ?                          ileocecal valve. The colonoscopy was performed  ?                          without difficulty. The patient tolerated the  ?                          procedure well. The quality of the bowel  ?                          preparation was good. The ileocecal valve,  ?                          appendiceal orifice, and rectum were photographed. ?Scope In: 8:54:23 AM ?Scope Out: 9:11:03 AM ?Scope Withdrawal Time: 0 hours 13 minutes 31 seconds  ?Total Procedure Duration: 0 hours 16 minutes 40 seconds  ?Findings:                 The perianal and digital rectal examinations were  ?                          normal. ?                          Multiple small and large-mouthed diverticula were  ?                          found in the sigmoid colon. ?  The exam was otherwise without abnormality on  ?                          direct and retroflexion views. ?Complications:            No immediate complications. ?Estimated Blood Loss:     Estimated blood loss: none. ?Impression:               - Diverticulosis in the sigmoid colon. ?                          - The examination was otherwise normal on direct  ?                          and retroflexion views. ?                          - No specimens collected. ?Recommendation:           - Patient has a contact number available for  ?                          emergencies. The signs and symptoms of potential  ?                          delayed complications were discussed with the  ?                          patient. Return to normal activities tomorrow.  ?                          Written discharge instructions were provided to the  ?                          patient. ?                          - Resume previous diet. ?                           - Continue present medications. ?                          - Repeat colonoscopy in 10 years for surveillance. ?Jerene Bears, MD ?12/09/2021 9:13:35 AM ?This report has been signed electronically. ?

## 2021-12-09 NOTE — Progress Notes (Signed)
Report to pacu rn; vss. ?

## 2021-12-11 ENCOUNTER — Telehealth: Payer: Self-pay | Admitting: *Deleted

## 2021-12-11 NOTE — Telephone Encounter (Signed)
First follow up call attempt.  LVM. 

## 2021-12-11 NOTE — Telephone Encounter (Signed)
?  Follow up Call- ? ? ?  12/09/2021  ?  8:23 AM  ?Call back number  ?Post procedure Call Back phone  # [phone]  ?Permission to leave phone message Yes  ?  ? ?Patient questions: ? ?Do you have a fever, pain , or abdominal swelling? No. ?Pain Score  0 * ? ?Have you tolerated food without any problems? Yes.   ? ?Have you been able to return to your normal activities? Yes.   ? ?Do you have any questions about your discharge instructions: ?Diet   No. ?Medications  No. ?Follow up visit  No. ? ?Do you have questions or concerns about your Care? No. ? ?Actions: ?* If pain score is 4 or above: ?No action needed, pain <4. ? ? ?

## 2022-01-12 ENCOUNTER — Encounter: Payer: Self-pay | Admitting: Plastic Surgery

## 2022-01-12 ENCOUNTER — Ambulatory Visit (INDEPENDENT_AMBULATORY_CARE_PROVIDER_SITE_OTHER): Payer: Self-pay | Admitting: Plastic Surgery

## 2022-01-12 DIAGNOSIS — Z719 Counseling, unspecified: Secondary | ICD-10-CM

## 2022-01-12 NOTE — Progress Notes (Signed)

## 2022-02-18 ENCOUNTER — Other Ambulatory Visit: Payer: Self-pay | Admitting: Cardiology

## 2022-02-18 DIAGNOSIS — E7800 Pure hypercholesterolemia, unspecified: Secondary | ICD-10-CM

## 2022-02-18 MED ORDER — ROSUVASTATIN CALCIUM 20 MG PO TABS: 20.0000 mg | ORAL_TABLET | Freq: Every day | ORAL | 1 refills | Status: DC

## 2022-05-27 ENCOUNTER — Encounter: Payer: Self-pay | Admitting: Nurse Practitioner

## 2022-05-27 ENCOUNTER — Ambulatory Visit (INDEPENDENT_AMBULATORY_CARE_PROVIDER_SITE_OTHER): Payer: 59 | Admitting: Nurse Practitioner

## 2022-05-27 ENCOUNTER — Other Ambulatory Visit (HOSPITAL_COMMUNITY)
Admission: RE | Admit: 2022-05-27 | Discharge: 2022-05-27 | Disposition: A | Discharge status: Home / Self Care | Payer: 59 | Source: Ambulatory Visit | Attending: Nurse Practitioner | Admitting: Nurse Practitioner

## 2022-05-27 VITALS — BP 122/86 | HR 104 | Ht 60.75 in | Wt 143.0 lb

## 2022-05-27 DIAGNOSIS — Z8262 Family history of osteoporosis: Secondary | ICD-10-CM | POA: Diagnosis not present

## 2022-05-27 DIAGNOSIS — Z780 Asymptomatic menopausal state: Secondary | ICD-10-CM | POA: Diagnosis not present

## 2022-05-27 DIAGNOSIS — Z01419 Encounter for gynecological examination (general) (routine) without abnormal findings: Secondary | ICD-10-CM

## 2022-05-27 DIAGNOSIS — N951 Menopausal and female climacteric states: Secondary | ICD-10-CM

## 2022-05-27 LAB — COMPREHENSIVE METABOLIC PANEL
AG Ratio: 1.6 (calc) (ref 1.0–2.5)
ALT: 10 U/L (ref 6–29)
AST: 16 U/L (ref 10–35)
Albumin: 4.5 g/dL (ref 3.6–5.1)
Alkaline phosphatase (APISO): 61 U/L (ref 37–153)
BUN/Creatinine Ratio: 17 (calc) (ref 6–22)
BUN: 18 mg/dL (ref 7–25)
CO2: 26 mmol/L (ref 20–32)
Calcium: 9.4 mg/dL (ref 8.6–10.4)
Chloride: 105 mmol/L (ref 98–110)
Creat: 1.09 mg/dL — ABNORMAL HIGH (ref 0.50–1.03)
Globulin: 2.8 g/dL (calc) (ref 1.9–3.7)
Glucose, Bld: 96 mg/dL (ref 65–99)
Potassium: 4.2 mmol/L (ref 3.5–5.3)
Sodium: 141 mmol/L (ref 135–146)
Total Bilirubin: 0.5 mg/dL (ref 0.2–1.2)
Total Protein: 7.3 g/dL (ref 6.1–8.1)

## 2022-05-27 MED ORDER — VEOZAH 45 MG PO TABS: 1.0000 | ORAL_TABLET | Freq: Every day | ORAL | 5 refills | Status: DC

## 2022-05-27 MED ORDER — ESTRADIOL 0.1 MG/GM VA CREA: 1.0000 g | TOPICAL_CREAM | VAGINAL | 2 refills | Status: DC

## 2022-05-27 NOTE — Progress Notes (Signed)
Hayley Stewart 05/04/1966 [id number]   History:  56 y.o. G1P1 presents for annual exam. Postmenopausal - on HRT with good relief of hot flashes, no bleeding. She stopped HRT in July and did really good for 5-6 weeks but then hot flashes and night sweats came back terribly (~8 per hour). She lost weight after stopping HRT which she feels was fluid-related. As soon as she started HRT back the weight returned. She wants to know if there are other alternatives. Normal pap and mammogram history. Thyroidectomy around 10 years ago, managed by endocrinology.   Gynecologic History Contraception: post menopausal status Sexually active: Yes  Health Maintenance Last Pap: 05/24/2017. Results were: Normal, 5-year repeat Last mammogram: 09/07/2021. Results were: Normal Last colonoscopy: 12/09/2021. Results were: Normal, 10-year recall Last Dexa: Not indicated  Past medical history, past surgical history, family history and social history were all reviewed and documented in the EPIC chart. Married. 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 19 yo daughter [employer], graduates in May.   ROS:  A ROS was performed and pertinent positives and negatives are included.  Exam:  Vitals:   05/27/22 1002  BP: 122/86  Pulse: (!) 104  SpO2: 94%  Weight: 143 lb (64.9 kg)  Height: 5' 0.75" (1.543 m)     Body mass index is 27.24 kg/m.  General appearance:  Normal Thyroid:  Symmetrical, normal in size, without palpable masses or nodularity. Respiratory  Auscultation:  Clear without wheezing or rhonchi Cardiovascular  Auscultation:  Regular rate, without rubs, murmurs or gallops  Edema/varicosities:  Not grossly evident Abdominal  Soft,nontender, without masses, guarding or rebound.  Liver/spleen:  No organomegaly noted  Hernia:  None appreciated  Skin  Inspection:  Grossly normal   Breasts: Examined lying and sitting.   Right: Without masses, retractions, discharge or axillary  adenopathy.   Left: Without masses, retractions, discharge or axillary adenopathy. Genitourinary   Inguinal/mons:  Normal without inguinal adenopathy  External genitalia:  Normal appearing vulva with no masses, tenderness, or lesions  BUS/Urethra/Skene's glands:  Normal  Vagina:  Normal appearing with normal color and discharge, no lesions.  Cervix:  Normal appearing without discharge or lesions  Uterus:  Normal in size, shape and contour.  Midline and mobile, nontender  Adnexa/parametria:     Rt: Normal in size, without masses or tenderness.   Lt: Normal in size, without masses or tenderness.  Anus and perineum: Normal  Digital rectal exam: Deferred  Patient informed chaperone available to be present for breast and pelvic exam. Patient has requested no chaperone to be present. Patient has been advised what will be completed during breast and pelvic exam.   Assessment/Plan:  56 y.o. G1P1 for annual exam.   Well female exam with routine gynecological exam - Plan: Cytology - PAP( [place]), Comp Met ([facility]). Education provided on SBEs, importance of preventative screenings, current guidelines, high calcium diet, regular exercise, and multivitamin daily.   Family history of osteoporosis in mother - Plan: DG Bone Density.  Postmenopausal - Plan: DG Bone Density. On HRT, no bleeding  Vasomotor symptoms due to menopause - Plan: Fezolinetant (VEOZAH) 45 MG TABS daily. Currently on HRT but experiencing weight gain/water retention. Will wean off and switch to [facility]. LFTs today. Repeat in 3 months.   Menopausal vaginal dryness - Plan: estradiol (ESTRACE VAGINAL) 0.1 MG/GM vaginal cream twice weekly. She had worsening vaginal dryness when she stopped her HRT this summer.   Screening for cervical cancer - Normal Pap history.  Pap today.   Screening for breast cancer - Normal mammogram history.  Continue annual screenings.  Normal breast exam today.  Screening for colon cancer - 11/2021  colonoscopy. Will repeat at GI's recommended interval.   Follow up in 1 year for annual.      [name] [facility], 10:27 AM 05/27/2022

## 2022-05-31 ENCOUNTER — Other Ambulatory Visit: Payer: Self-pay | Admitting: *Deleted

## 2022-06-02 LAB — CYTOLOGY - PAP
Comment: NEGATIVE
Diagnosis: UNDETERMINED — AB
High risk HPV: NEGATIVE

## 2022-06-03 ENCOUNTER — Telehealth: Payer: Self-pay | Admitting: *Deleted

## 2022-06-03 NOTE — Telephone Encounter (Signed)
PA done via cover my meds for Veozah 45 mg tablet. Medication denied by [employer]   " The requested medication and/or diagnosis are not a covered benefit and are excluded from coverage in accordance with the terms and conditions of your plan benefit. Therefore, this request has been administratively denied."

## 2022-06-14 ENCOUNTER — Encounter: Payer: Self-pay | Admitting: Nurse Practitioner

## 2022-06-14 DIAGNOSIS — N951 Menopausal and female climacteric states: Secondary | ICD-10-CM

## 2022-06-14 MED ORDER — ESTRADIOL 0.1 MG/GM VA CREA: 1.0000 g | TOPICAL_CREAM | VAGINAL | 2 refills | Status: DC

## 2022-06-14 NOTE — Telephone Encounter (Signed)
Per PA " PA done via cover my meds for Veozah 45 mg tablet. Medication denied by [employer]    " The requested medication and/or diagnosis are not a covered benefit and are excluded from coverage in accordance with the terms and conditions of your plan benefit. Therefore, this request has been administratively denied."

## 2022-07-02 ENCOUNTER — Encounter: Payer: Self-pay | Admitting: Nurse Practitioner

## 2022-07-02 DIAGNOSIS — Z79890 Hormone replacement therapy (postmenopausal): Secondary | ICD-10-CM

## 2022-07-05 MED ORDER — PROGESTERONE MICRONIZED 100 MG PO CAPS: 100.0000 mg | ORAL_CAPSULE | Freq: Every day | ORAL | 2 refills | Status: DC

## 2022-07-05 MED ORDER — ESTRADIOL 0.5 MG PO TABS: 0.5000 mg | ORAL_TABLET | Freq: Every day | ORAL | 2 refills | Status: DC

## 2022-07-28 ENCOUNTER — Ambulatory Visit (INDEPENDENT_AMBULATORY_CARE_PROVIDER_SITE_OTHER): Payer: Self-pay | Admitting: Student

## 2022-07-28 DIAGNOSIS — Z719 Counseling, unspecified: Secondary | ICD-10-CM

## 2022-07-28 NOTE — Progress Notes (Signed)
Botulinum Toxin Procedure Note  Procedure: Cosmetic botulinum toxin  Pre-operative Diagnosis: Dynamic rhytides  Post-operative Diagnosis: Same  Complications:  None  Brief history: The patient desires botulinum toxin injection.  She is aware of the risks including bleeding, damage to deeper structures, asymmetry, brow ptosis, eyelid ptosis, bruising. The patient understands and wishes to proceed.  Procedure: The area was prepped with alcohol and dried with a clean gauze.  Using a clean technique the botulinum toxin was diluted with 2.5 mL of bacteriostatic saline per 100 unit vial which resulted in 4 units per 0.1 mL.  Subsequently the mixture was injected in the glabellar, lateral canthal lines, forehead area with preservation of the temporal branch to the lateral eyebrow. A total of 30 Units of botulinum toxin was used. The forehead and glabellar area was injected with care to inject intramuscular only while holding pressure on the supratrochlear vessels in each area during each injection on either side of the medial corrugators. The injection proceeded vertically superiorly to the medial 2/3 of the frontalis muscle and superior 2/3 of the lateral frontalis, again with preservation of the frontal branch.  No complications were noted. Light pressure was held for 5 minutes. She was instructed explicitly in post-operative care.  Botox LOT:  [id number]  EXP:  09/2024

## 2022-08-02 ENCOUNTER — Other Ambulatory Visit: Payer: Self-pay

## 2022-08-02 ENCOUNTER — Encounter (HOSPITAL_BASED_OUTPATIENT_CLINIC_OR_DEPARTMENT_OTHER): Payer: Self-pay | Admitting: Emergency Medicine

## 2022-08-02 ENCOUNTER — Emergency Department (HOSPITAL_BASED_OUTPATIENT_CLINIC_OR_DEPARTMENT_OTHER)
Admission: EM | Admit: 2022-08-02 | Discharge: 2022-08-02 | Disposition: A | Discharge status: Home / Self Care | Payer: 59 | Attending: Emergency Medicine | Admitting: Emergency Medicine

## 2022-08-02 ENCOUNTER — Emergency Department (HOSPITAL_BASED_OUTPATIENT_CLINIC_OR_DEPARTMENT_OTHER): Payer: 59

## 2022-08-02 DIAGNOSIS — I471 Supraventricular tachycardia, unspecified: Secondary | ICD-10-CM | POA: Diagnosis not present

## 2022-08-02 DIAGNOSIS — R002 Palpitations: Secondary | ICD-10-CM | POA: Diagnosis present

## 2022-08-02 LAB — CBC WITH DIFFERENTIAL/PLATELET
Abs Immature Granulocytes: 0.02 10*3/uL (ref 0.00–0.07)
Basophils Absolute: 0 10*3/uL (ref 0.0–0.1)
Basophils Relative: 0 %
Eosinophils Absolute: 0.2 10*3/uL (ref 0.0–0.5)
Eosinophils Relative: 2 %
HCT: 42.4 % (ref 36.0–46.0)
Hemoglobin: 14.3 g/dL (ref 12.0–15.0)
Immature Granulocytes: 0 %
Lymphocytes Relative: 22 %
Lymphs Abs: 1.6 10*3/uL (ref 0.7–4.0)
MCH: 30.2 pg (ref 26.0–34.0)
MCHC: 33.7 g/dL (ref 30.0–36.0)
MCV: 89.6 fL (ref 80.0–100.0)
Monocytes Absolute: 0.5 10*3/uL (ref 0.1–1.0)
Monocytes Relative: 7 %
Neutro Abs: 5 10*3/uL (ref 1.7–7.7)
Neutrophils Relative %: 69 %
Platelets: 370 10*3/uL (ref 150–400)
RBC: 4.73 MIL/uL (ref 3.87–5.11)
RDW: 11.4 % — ABNORMAL LOW (ref 11.5–15.5)
WBC: 7.3 10*3/uL (ref 4.0–10.5)
nRBC: 0 % (ref 0.0–0.2)

## 2022-08-02 LAB — COMPREHENSIVE METABOLIC PANEL
ALT: 18 U/L (ref 0–44)
AST: 21 U/L (ref 15–41)
Albumin: 4.6 g/dL (ref 3.5–5.0)
Alkaline Phosphatase: 65 U/L (ref 38–126)
Anion gap: 14 (ref 5–15)
BUN: 21 mg/dL — ABNORMAL HIGH (ref 6–20)
CO2: 25 mmol/L (ref 22–32)
Calcium: 9.9 mg/dL (ref 8.9–10.3)
Chloride: 102 mmol/L (ref 98–111)
Creatinine, Ser: 1.03 mg/dL — ABNORMAL HIGH (ref 0.44–1.00)
GFR, Estimated: >60 mL/min (ref >60)
Glucose, Bld: 140 mg/dL — ABNORMAL HIGH (ref 70–99)
Potassium: 3.6 mmol/L (ref 3.5–5.1)
Sodium: 141 mmol/L (ref 135–145)
Total Bilirubin: 0.4 mg/dL (ref 0.3–1.2)
Total Protein: 7.8 g/dL (ref 6.5–8.1)

## 2022-08-02 LAB — TROPONIN I (HIGH SENSITIVITY)
Troponin I (High Sensitivity): 11 ng/L (ref <18)
Troponin I (High Sensitivity): 170 ng/L (ref <18)
Troponin I (High Sensitivity): 193 ng/L (ref <18)

## 2022-08-02 LAB — D-DIMER, QUANTITATIVE: D-Dimer, Quant: 1.81 ug/mL-FEU — ABNORMAL HIGH (ref 0.00–0.50)

## 2022-08-02 MED ORDER — IOHEXOL 350 MG/ML SOLN
75.0000 mL | Freq: Once | INTRAVENOUS | Status: AC | PRN
  Administered 2022-08-02: 75 mL via INTRAVENOUS

## 2022-08-02 MED ORDER — LACTATED RINGERS IV BOLUS
1000.0000 mL | Freq: Once | INTRAVENOUS | Status: AC
  Administered 2022-08-02: 1000 mL via INTRAVENOUS

## 2022-08-02 MED ORDER — METOPROLOL TARTRATE 25 MG PO TABS: 25.0000 mg | ORAL_TABLET | ORAL | 0 refills | Status: AC | PRN

## 2022-08-02 NOTE — ED Notes (Signed)
Patient ambulatory to restroom with steady gait. Returned to room and placed on continuous vital monitoring.

## 2022-08-02 NOTE — ED Notes (Signed)
Date and time results received: 08/02/22 2200 (use smartphrase ".now" to insert current time)  Test: troponin Critical Value: 193  Name of Provider Notified: Dr Oswald Hillock

## 2022-08-02 NOTE — ED Notes (Signed)
Back from the b/r, steady gait, HR increased transiently after ambulating. HR 122 and trending down upon resting back in stretcher.

## 2022-08-02 NOTE — ED Provider Notes (Signed)
[facility] EMER[facility]ENC[facility] DEPT Provider Note   CSN: [id number] Arrival date & time: 08/02/22  1536     History Chief Complaint  Patient presents with   Palpitations    HPI Hayley Stewart is a 57 y.o. female presenting for chief complaint of palpitations.  She is a 57 year old female with a minimal medical history.  History hyperlipidemia and substantial cardiac disease in the family.  On statins at home. States that earlier today she bent down to pick something up when she had sudden onset palpitations and lightheadedness.  Denies any history of similar though she does have a history of frequent syncope in the outpatient setting being evaluated and followed by cardiology.  She denies fevers chills nausea vomiting syncope shortness of breath.  She has had an upper respiratory infection throughout the week this week. She endorses that she recently restarted estrogen hormonal therapy..   Patient's recorded medical, surgical, social, medication list and allergies were reviewed in the Snapshot window as part of the initial history.   Review of Systems   Review of Systems  Constitutional:  Negative for chills and fever.  HENT:  Negative for ear pain and sore throat.   Eyes:  Negative for pain and visual disturbance.  Respiratory:  Negative for cough and shortness of breath.   Cardiovascular:  Positive for palpitations. Negative for chest pain.  Gastrointestinal:  Negative for abdominal pain and vomiting.  Genitourinary:  Negative for dysuria and hematuria.  Musculoskeletal:  Negative for arthralgias and back pain.  Skin:  Negative for color change and rash.  Neurological:  Negative for seizures and syncope.  All other systems reviewed and are negative.   Physical Exam Updated Vital Signs BP 129/88   Pulse (!) 101   Temp 98.1 F (36.7 C) (Oral)   Resp 16   Ht '5\' 1"'$  (1.549 m)   Wt 63.5 kg   LMP 08/12/2015   SpO2 100%   BMI 26.45 kg/m  Physical Exam Vitals and  nursing note reviewed.  Constitutional:      General: She is not in acute distress.    Appearance: She is well-developed.  HENT:     Head: Normocephalic and atraumatic.  Eyes:     Conjunctiva/sclera: Conjunctivae normal.  Cardiovascular:     Rate and Rhythm: Normal rate and regular rhythm.     Heart sounds: No murmur heard. Pulmonary:     Effort: Pulmonary effort is normal. No respiratory distress.     Breath sounds: Normal breath sounds.  Abdominal:     General: There is no distension.     Palpations: Abdomen is soft.     Tenderness: There is no abdominal tenderness. There is no right CVA tenderness or left CVA tenderness.  Musculoskeletal:        General: No swelling or tenderness. Normal range of motion.     Cervical back: Neck supple.  Skin:    General: Skin is warm and dry.  Neurological:     General: No focal deficit present.     Mental Status: She is alert and oriented to person, place, and time. Mental status is at baseline.     Cranial Nerves: No cranial nerve deficit.      ED Course/ Medical Decision Making/ A&P Clinical Course as of 08/02/22 2221  Mon Aug 02, 2022  2031 Asymptomatic. Trop likely residual 2/2 SVT earlier, now treated [CC]    Clinical Course User Index [CC] Tretha Sciara, MD    Procedures Procedures  Medications Ordered in ED Medications  iohexol (OMNIPAQUE) 350 MG/ML injection 75 mL (75 mLs Intravenous Contrast Given 08/02/22 1729)  lactated ringers bolus 1,000 mL (0 mLs Intravenous Stopped 08/02/22 2034)    Medical Decision Making:    SKYLEIGH WINDLE is a 57 y.o. female who presented to the ED today with palpitations detailed above.     Patient's presentation is complicated by their history of multiple comorbid medical problems.  Patient placed on continuous vitals and telemetry monitoring while in ED which was reviewed periodically.   Complete initial physical exam performed, notably the patient  was hemodynamically stable in no  acute distress.      Reviewed and confirmed nursing documentation for past medical history, family history, social history.    Initial Assessment:   Patient presenting critically ill with a heart rate of 210 on arrival.  I was called emergently to bedside for therapeutic intervention.  History of heart disease in the family as well as hyperlipidemia.  She has had no chest pain.  She had an episode of lightheadedness earlier today.  Initial EKG demonstrating supraventricular tachycardia.  While preparing for cardioversion, vagal maneuvers were attempted with spontaneous restoration of normal sinus rhythm.  Restoration of normal sinus rhythm with heart rate 120 achieved.  Patient has had an upper respiratory infection throughout the week this week with rhinosinusitis.  She worked out this morning and drank a caffeinated beverage is most likely etiologies of patient's triggered episode of SVT.  I also considered PE, ACS, aortic dissection all as possible etiologies of patient's new diagnosis of SVT. Patient warrants immediate diagnostic evaluation to evaluate for these conditions.  Delta troponin series, D-dimer, screening labs all ordered  Final Assessment and Plan:   D-dimer resulted elevated.  Proceeded to CT angiography of the chest to evaluate for pulmonary embolism or aortic dissection.  Fortunately angiography negative at this time. Patient was observed in the emergency department for 6-1/2 hours and remained completely asymptomatic during her stay.  Troponin climbed initially 17-170 before leveling off at 190. Given patient's history of present illness and physical exam findings, complete lack of symptoms throughout the day today, her presentation is not consistent with ACS.  Troponin elevation more consistent with SVT with heart rate of 200-2 10 as the etiology.  Will treat patient with metoprolol 5 mg as needed and recommend she follow-up with her cardiologist which she is already established with.   Given prolonged observation in the emergency department with no further symptoms, patient stable for outpatient care management without any acute indication for further intervention.  Disposition:  I have considered need for hospitalization, however, considering all of the above, I believe this patient is stable for discharge at this time.  Patient/family educated about specific return precautions for given chief complaint and symptoms.  Patient/family educated about follow-up with PCP and cardiology.     Patient/family expressed understanding of return precautions and need for follow-up. Patient spoken to regarding all imaging and laboratory results and appropriate follow up for these results. All education provided in verbal form with additional information in written form. Time was allowed for answering of patient questions. Patient discharged.    Emergency Department Medication Summary:   Medications  iohexol (OMNIPAQUE) 350 MG/ML injection 75 mL (75 mLs Intravenous Contrast Given 08/02/22 1729)  lactated ringers bolus 1,000 mL (0 mLs Intravenous Stopped 08/02/22 2034)       Clinical Impression:  1. SVT (supraventricular tachycardia)      Discharge   Final  Clinical Impression(s) / ED Diagnoses Final diagnoses:  SVT (supraventricular tachycardia)    Rx / DC Orders ED Discharge Orders          Ordered    metoprolol tartrate (LOPRESSOR) 25 MG tablet  As needed        08/02/22 2219              Tretha Sciara, MD 08/02/22 2221

## 2022-08-02 NOTE — ED Triage Notes (Signed)
"  My heart is going crazy" Reports feeling racing heart starting around 2pm.  Some lightheadedness and sob  Restarted

## 2022-08-02 NOTE — Discharge Instructions (Signed)
You were seen today for palpitations.  Your heart rate was 200 when you arrived.  Fortunately you responded to vagal maneuvers to correct your heart rate. It appears that you had supraventricular tachycardia on arrival.  We have not identified any other emergency condition today. Your troponin did elevate from 11-170-190 over the 6 hours you were here, however this would be expected because of your supraventricular tachycardia. This does not seem consistent with a heart attack at this time though you do have risk factors for heart disease.  I would recommend that you follow-up with your cardiologist as soon as possible ideally within 48 hours and return to the emergency department if you have any symptoms including chest pain shortness of breath or further palpitations. I have prescribed you metoprolol 25 mg as needed should you have episodes of palpitations.  If you need to use this medicine, you will likely need to return to the emergency department if you are not having resolution of symptoms within 10 to 15 minutes.

## 2022-08-02 NOTE — ED Notes (Signed)
Tropin 170 reported to Dr. Oswald Hillock

## 2022-08-18 ENCOUNTER — Ambulatory Visit: Payer: 59 | Admitting: Cardiology

## 2022-08-23 ENCOUNTER — Encounter: Payer: Self-pay | Admitting: Cardiology

## 2022-08-23 ENCOUNTER — Ambulatory Visit: Payer: 59 | Admitting: Cardiology

## 2022-08-23 VITALS — BP 121/75 | HR 70 | Resp 16 | Ht 61.0 in | Wt 142.6 lb

## 2022-08-23 DIAGNOSIS — E7800 Pure hypercholesterolemia, unspecified: Secondary | ICD-10-CM

## 2022-08-23 DIAGNOSIS — R931 Abnormal findings on diagnostic imaging of heart and coronary circulation: Secondary | ICD-10-CM

## 2022-08-23 DIAGNOSIS — I6523 Occlusion and stenosis of bilateral carotid arteries: Secondary | ICD-10-CM

## 2022-08-23 DIAGNOSIS — I471 Supraventricular tachycardia, unspecified: Secondary | ICD-10-CM

## 2022-08-23 NOTE — Progress Notes (Signed)
Primary Physician/Referring:  Glenis Smoker, MD  Patient ID: Hayley Stewart, female    DOB: [birth year], 57 y.o.   MRN: [id number]  Chief Complaint  Patient presents with   Agatston coronary artery calcium score between 200 and 399   Hyperlipidemia   Follow-up   HPI:    Hayley Stewart  is a 57 y.o. Caucasian female with family history of carotid disease in her father in his early 11s, maternal grandfather had multiple cardiac revascularization procedures in his 43s, hyperlipidemia, bilateral carotid atherosclerosis with heterogenous plaque by duplex in 2021 without significant stenosis, partial thyroidectomy and presently on thyroid supplements.   Patient was seen in the emergency room on 08/02/2022, with sudden onset of palpitations and lightheadedness and in the ED was found to have PSVT, spontaneously converted to sinus rhythm with Valsalva and vagal maneuver, however due to elevated D-dimer CT angiogram of the chest was performed and eventually discharged home.  Troponins were mildly elevated as well.  She now presents for follow-up.  She remains asymptomatic and has not had any recurrence of palpitations.  Past Medical History:  Diagnosis Date   Adenomatous colon polyp 10/2016   Elevated cholesterol    Laryngopharyngeal reflux    Dr. Redmond Baseman (ENT)   Thyroid disease    Past Surgical History:  Procedure Laterality Date   BREAST BIOPSY Right 08/20/2003   BREAST EXCISIONAL BIOPSY Right 08/07/2001   BREAST SURGERY     right breast biopsy/FIBROMA OF RT BREAST   COLONOSCOPY  11/01/2016   THYROIDECTOMY, PARTIAL  09/2009   R lobectomy/BENIGN   Family History  Problem Relation Age of Onset   Hypertension Mother    Hyperlipidemia Mother    Osteoporosis Mother    Hypertension Father    Heart disease Father    Osteoporosis Maternal Aunt    Osteoporosis Maternal Aunt    Colon cancer Paternal Uncle    Breast cancer Maternal Grandmother    Heart disease Maternal Grandfather     Esophageal cancer Neg Hx    Rectal cancer Neg Hx    Stomach cancer Neg Hx    Colon polyps Neg Hx     Social History   Tobacco Use   Smoking status: Never    Passive exposure: Past (childhood until age 57, father smoker)   Smokeless tobacco: Never  Substance Use Topics   Alcohol use: Not Currently    Comment: 1 drink per month.   Marital Status: Married   ROS  Review of Systems  Cardiovascular:  Negative for chest pain, dyspnea on exertion and leg swelling.    Objective  Blood pressure 121/75, pulse 70, resp. rate 16, height '5\' 1"'$  (1.549 m), weight 142 lb 9.6 oz (64.7 kg), last menstrual period 08/12/2015, SpO2 97 %.     08/23/2022    3:29 PM 08/02/2022   10:30 PM 08/02/2022   10:00 PM  Vitals with BMI  Height '5\' 1"'$     Weight 142 lbs 10 oz    BMI 53.61    Systolic 443 154 008  Diastolic 75 81 79  Pulse 70 96 104     Physical Exam Neck:     Vascular: No carotid bruit or JVD.  Cardiovascular:     Rate and Rhythm: Normal rate and regular rhythm.     Pulses: Intact distal pulses.     Heart sounds: Normal heart sounds. No murmur heard.    No gallop.  Pulmonary:     Effort: Pulmonary effort is  normal.     Breath sounds: Normal breath sounds.  Abdominal:     General: Bowel sounds are normal.     Palpations: Abdomen is soft.  Musculoskeletal:     Right lower leg: No edema.     Left lower leg: No edema.   Physical exam remains unchanged compared to previous office visit.  Laboratory examination:   Lab Results  Component Value Date   NA 141 08/02/2022   K 3.6 08/02/2022   CO2 25 08/02/2022   GLUCOSE 140 (H) 08/02/2022   BUN 21 (H) 08/02/2022   CREATININE 1.03 (H) 08/02/2022   CALCIUM 9.9 08/02/2022   GFRNONAA >60 08/02/2022    Lab Results  Component Value Date   WBC 7.3 08/02/2022   HGB 14.3 08/02/2022   HCT 42.4 08/02/2022   MCV 89.6 08/02/2022   PLT 370 08/02/2022   Lab Results  Component Value Date   CHOL 191 06/16/2021   HDL 58 06/16/2021    LDLCALC 114 (H) 06/16/2021   LDLDIRECT 81 06/12/2020   TRIG 108 06/16/2021    External labs:  12/24/2019:  HDL 61, LDL 163, triglycerides 95, total cholesterol 240.  NHDL Chol 179 BUN 17, serum creatinine 0.99 Glucose 100  06/05/2019: TSH 1.310  Allergies   Allergies  Allergen Reactions   Lipitor [Atorvastatin] Other (See Comments)    Myalgia and arthralgia and diarrhea   Nitrofurantoin Monohyd Macro Nausea And Vomiting   Penicillins Other (See Comments)    Historical from mother.    Final Medications at End of Visit     Current Outpatient Medications:    ALPRAZolam (XANAX) 0.25 MG tablet, Take 0.25 mg by mouth at bedtime as needed., Disp: , Rfl:    aspirin (ASPIRIN CHILDRENS) 81 MG chewable tablet, Chew 1 tablet (81 mg total) by mouth daily., Disp: , Rfl:    estradiol (ESTRACE) 0.5 MG tablet, Take 1 tablet (0.5 mg total) by mouth daily., Disp: 90 tablet, Rfl: 2   levothyroxine (SYNTHROID) 75 MCG tablet, Take 1 tablet (75 mcg total) by mouth daily. Mon-Sat, Disp: 90 tablet, Rfl: 4   metoprolol tartrate (LOPRESSOR) 25 MG tablet, Take 1 tablet (25 mg total) by mouth as needed for up to 15 doses., Disp: 15 tablet, Rfl: 0   Multiple Vitamins-Minerals (MULTIVITAMIN WITH MINERALS) tablet, Take 1 tablet by mouth daily., Disp: , Rfl:    pantoprazole (PROTONIX) 40 MG tablet, Take 40 mg by mouth daily., Disp: , Rfl:    phenazopyridine (PYRIDIUM) 95 MG tablet, Take 95 mg by mouth 3 (three) times daily as needed for pain., Disp: , Rfl:    progesterone (PROMETRIUM) 100 MG capsule, Take 1 capsule (100 mg total) by mouth daily., Disp: 90 capsule, Rfl: 2   sertraline (ZOLOFT) 50 MG tablet, Take 25-50 mg by mouth daily., Disp: , Rfl:    Radiology:   CT angiogram chest 08/02/2022: 1. Negative for acute pulmonary embolus or aortic dissection. No evidence of pulmonary embolism. Normal heart size. No pericardial effusion. Nonaneurysmal aorta. No dissection is seen No acute airspace disease. 2.  Small bilateral pulmonary nodules measuring up to 5 mm. No follow-up needed if patient is low-risk  Cardiac Studies:   Coronary calcium score 05/05/2020: Total calcium score 222.  Which is between 90th-100 percentile for a female patient this age. LAD 108, circumflex 0, RCA 114.  Small hiatal hernia.  Carotid artery duplex  04/21/2020: Mild heterogenous plaque in bilateral internal carotid arteries without hemodynamically significant stenosis.  Stenosis in the right  external carotid artery (<50%). Antegrade right vertebral artery flow. Antegrade left vertebral artery flow. Follow up is appropriate if clinically indicated.  Exercise treadmill stress test 04/23/2020: Exercise treadmill stress test performed using Bruce protocol.  Patient reached 8.8 METS, and 105% of age predicted maximum heart rate.  Exercise capacity was fair.  No chest pain reported.  Normal heart rate and hemodynamic response. Stress EKG revealed no ischemic changes. Low risk study.  Echocardiogram 06/30/2020:  Left ventricle cavity is normal in size and wall thickness. Normal global  wall motion. Normal LV systolic function with EF 59%. Normal diastolic  filling pattern.  Aneurysmal interatrial septum without 2D or color Doppler evidence of  interatrial shunt.  Mild (Grade I) mitral regurgitation.  Mild tricuspid regurgitation.  No evidence of pulmonary hypertension.  EKG   EGD 08/02/2022 at 1555 hrs.: Normal sinus rhythm at the rate of 120 bpm, normal EKG.  Compared EKG at 1543 hrs. SVT at the rate of 211 bpm no longer present.    06/22/2021: Sinus rhythm at a rate of 73 bpm.  Normal axis.  No evidence of ischemia or underlying injury pattern.  Compared to EKG 04/15/2020, no significant change.     Assessment     ICD-10-CM   1. PSVT (paroxysmal supraventricular tachycardia)  I47.10     2. Agatston coronary artery calcium score 222 On 05/05/2020 in the 90th to 100% in [place]  R93.1     3.  Atherosclerosis of both carotid arteries  I65.23     4. Hypercholesteremia  E78.00        Medications Discontinued During This Encounter  Medication Reason   estradiol (ESTRACE VAGINAL) 0.1 MG/GM vaginal cream    Fezolinetant (VEOZAH) 45 MG TABS Cost of medication   omeprazole (PRILOSEC) 20 MG capsule Change in therapy     No orders of the defined types were placed in this encounter.  Recommendations:   LORAYNE GETCHELL is a 57 y.o. Caucasian female with family history of carotid disease in her father in his early 32s, maternal grandfather had multiple cardiac revascularization procedures in his 49s, hyperlipidemia, bilateral carotid atherosclerosis with heterogenous plaque by duplex in 2021 without significant stenosis, partial thyroidectomy and presently on thyroid supplements.   Patient was seen in the emergency room on 08/02/2022, with sudden onset of palpitations and lightheadedness and found to have PSVT, spontaneously converted to sinus rhythm with Valsalva and vagal maneuver. She now presents for follow-up.  1. 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  I discussed with her regarding medical options versus proceeding with ablation.  As this is the first episode, and it was terminated by Valsalva, she prefers to continue watchful waiting for now.  I have again reiterated Valsalva maneuver and vagal maneuvers with the patient.  I also advised her to take metoprolol to tartrate 2 tablets at the onset of palpitations.  The good news is that in spite of heart rate being 220 bpm, she did not have any ischemic changes although the troponins were elevated which is expected, and repeat EKG postconversion revealed normal sinus rhythm.  I reassured her.  2. Agatston coronary artery calcium score 222 On 05/05/2020 in the 90th to 100% in [place] She has extremely high coronary calcium score and is  presently on aggressive medical management with statins and Zetia, goal LDL <70 if not closer to 55.  She will follow-up with Dr. Sela Hilding  for management of lipids following this visit and for routine health maintenance.  3. Atherosclerosis of both carotid arteries Since being on statins, left carotid bruit has resolved.  Her examination is completely normal.  4. Hypercholesteremia Hyperlipidemia discussed with the patient, advised her that she should now come off of statins and also continue Zetia as she could not tolerate high dose of statins.  Unless she has recurrence of PSVT or has new cardiac issues, I will see her back on a as needed basis.  She is also my neighbor and she can easily contact me at any point as well.  I know her husband as well.  I also reviewed the CT angiogram of the chest that was performed, fortunately no aortic atherosclerosis, no aortic aneurysm.  40-minute office visit encounter in review of the chart, updating labs and discussions regarding complex arrhythmias.   Adrian Prows, MD, [facility] 08/23/2022, 4:49 PM Office: [phone] Fax: [phone] Pager: [phone]

## 2022-10-08 ENCOUNTER — Other Ambulatory Visit: Payer: Self-pay | Admitting: Nurse Practitioner

## 2022-10-08 DIAGNOSIS — Z139 Encounter for screening, unspecified: Secondary | ICD-10-CM

## 2022-11-19 ENCOUNTER — Ambulatory Visit
Admission: RE | Admit: 2022-11-19 | Discharge: 2022-11-19 | Disposition: A | Discharge status: Home / Self Care | Payer: 59 | Source: Ambulatory Visit | Attending: Nurse Practitioner | Admitting: Nurse Practitioner

## 2022-11-19 DIAGNOSIS — Z139 Encounter for screening, unspecified: Secondary | ICD-10-CM

## 2023-01-19 ENCOUNTER — Ambulatory Visit (INDEPENDENT_AMBULATORY_CARE_PROVIDER_SITE_OTHER): Payer: Self-pay | Admitting: Surgical

## 2023-01-19 DIAGNOSIS — Z719 Counseling, unspecified: Secondary | ICD-10-CM

## 2023-01-19 NOTE — Progress Notes (Signed)
Botulinum Toxin Procedure Note  Procedure: Cosmetic botulinum toxin  Pre-operative Diagnosis: Dynamic rhytides  Post-operative Diagnosis: Same  Complications:  None  Brief history: The patient desires botulinum toxin injection.  She is aware of the risks including bleeding, damage to deeper structures, asymmetry, brow ptosis, eyelid ptosis, bruising. The patient understands and wishes to proceed.  Procedure: The area was prepped with alcohol and dried with a clean gauze.  Using a clean technique the botulinum toxin was diluted with 2.5 mL of bacteriostatic saline per 100 unit vial which resulted in 4 units per 0.1 mL.  Subsequently the mixture was injected in the glabellar, lateral canthal lines, forehead area with preservation of the temporal branch to the lateral eyebrow. A total of 30 Units of botulinum toxin was used. The forehead and glabellar area was injected with care to inject intramuscular only while holding pressure on the supratrochlear vessels in each area during each injection on either side of the medial corrugators. The injection proceeded vertically superiorly to the medial 2/3 of the frontalis muscle and superior 2/3 of the lateral frontalis, again with preservation of the frontal branch.  No complications were noted. Light pressure was held for 5 minutes. She was instructed explicitly in post-operative care.  Botox LOT:  [id number] C2 EXP:  2026/05

## 2023-04-22 ENCOUNTER — Other Ambulatory Visit: Payer: Self-pay | Admitting: Obstetrics & Gynecology

## 2023-04-22 DIAGNOSIS — Z79890 Hormone replacement therapy (postmenopausal): Secondary | ICD-10-CM

## 2023-04-25 NOTE — Telephone Encounter (Signed)
Medication refill request: estradiol 0.5mg  & progesterone 100mg  Last AEX:  05-27-22 Next AEX: 05-30-23 Last MMG (if hormonal medication request): 11-19-22 birads 1:neg Refill authorized: please approve until aex if appropriate

## 2023-05-30 ENCOUNTER — Encounter: Payer: Self-pay | Admitting: Nurse Practitioner

## 2023-05-30 ENCOUNTER — Ambulatory Visit (INDEPENDENT_AMBULATORY_CARE_PROVIDER_SITE_OTHER): Payer: 59 | Admitting: Nurse Practitioner

## 2023-05-30 VITALS — BP 104/78 | HR 88 | Ht 62.0 in | Wt 141.0 lb

## 2023-05-30 DIAGNOSIS — Z01419 Encounter for gynecological examination (general) (routine) without abnormal findings: Secondary | ICD-10-CM | POA: Diagnosis not present

## 2023-05-30 DIAGNOSIS — Z79890 Hormone replacement therapy (postmenopausal): Secondary | ICD-10-CM | POA: Diagnosis not present

## 2023-05-30 MED ORDER — PROGESTERONE MICRONIZED 100 MG PO CAPS: 100.0000 mg | ORAL_CAPSULE | Freq: Every day | ORAL | 3 refills | Status: DC

## 2023-05-30 MED ORDER — ESTRADIOL 0.5 MG PO TABS: 0.5000 mg | ORAL_TABLET | Freq: Every day | ORAL | 3 refills | Status: DC

## 2023-05-30 NOTE — Progress Notes (Signed)
Hayley Stewart 06-Oct-1965 [id number]   History:  57 y.o. G1P1 presents for annual exam. Postmenopausal - on HRT with good relief of hot flashes, no bleeding. She stopped HRT last year and did not tolerate. Normal pap and mammogram history. Thyroidectomy around 10 years ago, managed by endocrinology. HLD managed by PCP.   Gynecologic History Contraception: post menopausal status Sexually active: Yes  Health Maintenance Last Pap: 05/27/2022. Results were: ASCUS neg HPV, 3-year repeat Last mammogram: 11/19/2022. Results were: Normal Last colonoscopy: 12/09/2021. Results were: Normal, 10-year recall Last Dexa: Not indicated  Past medical history, past surgical history, family history and social history were all reviewed and documented in the EPIC chart. Married. 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  ROS:  A ROS was performed and pertinent positives and negatives are included.  Exam:  Vitals:   05/30/23 1058  BP: 104/78  Pulse: 88  SpO2: 98%  Weight: 141 lb (64 kg)  Height: 5\' 2"  (1.575 m)      Body mass index is 25.79 kg/m.  General appearance:  Normal Thyroid:  Symmetrical, normal in size, without palpable masses or nodularity. Respiratory  Auscultation:  Clear without wheezing or rhonchi Cardiovascular  Auscultation:  Regular rate, without rubs, murmurs or gallops  Edema/varicosities:  Not grossly evident Abdominal  Soft,nontender, without masses, guarding or rebound.  Liver/spleen:  No organomegaly noted  Hernia:  None appreciated  Skin  Inspection:  Grossly normal   Breasts: Examined lying and sitting.   Right: Without masses, retractions, discharge or axillary adenopathy.   Left: Without masses, retractions, discharge or axillary adenopathy. Pelvic: External genitalia:  no lesions              Urethra:  normal appearing urethra with no masses, tenderness or lesions               Bartholins and Skenes: normal                 Vagina: normal appearing vagina with normal color and discharge, no lesions              Cervix: no lesions Bimanual Exam:  Uterus:  no masses or tenderness              Adnexa: no mass, fullness, tenderness              Rectovaginal: Deferred              Anus:  normal, no lesions  Patient informed chaperone available to be present for breast and pelvic exam. Patient has requested no chaperone to be present. Patient has been advised what will be completed during breast and pelvic exam.   Assessment/Plan:  57 y.o. G1P1 for annual exam.   Well female exam with routine gynecological exam -  Education provided on SBEs, importance of preventative screenings, current guidelines, high calcium diet, regular exercise, and multivitamin daily.   Postmenopausal hormone therapy - Plan: estradiol (ESTRACE) 0.5 MG tablet daily, progesterone (PROMETRIUM) 100 MG capsule nightly. Insurance did not cover Veozah last year, so she chose to restart HRT and is doing well. Aware of risks and benefits with use.  Screening for cervical cancer - 2023 ASCUS neg HPV, normal Pap history otherwise. Will repeat at 3-year interval per guidelines.   Screening for breast cancer - Normal mammogram history.  Continue annual screenings.  Normal breast exam today.  Screening for colon cancer -  11/2021 colonoscopy. Will repeat at GI's recommended interval.   Screening for osteoporosis - Mother with history of osteoporosis. Recommend DXA.   Return in about 1 year (around 05/29/2024) for Annual.     Hayley Stewart [facility], 11:32 AM 05/30/2023

## 2023-06-10 ENCOUNTER — Other Ambulatory Visit: Payer: Self-pay | Admitting: Medical Genetics

## 2023-06-10 DIAGNOSIS — Z006 Encounter for examination for normal comparison and control in clinical research program: Secondary | ICD-10-CM

## 2023-07-05 ENCOUNTER — Telehealth (HOSPITAL_BASED_OUTPATIENT_CLINIC_OR_DEPARTMENT_OTHER): Payer: Self-pay | Admitting: Pharmacy Technician

## 2023-07-05 ENCOUNTER — Ambulatory Visit (HOSPITAL_BASED_OUTPATIENT_CLINIC_OR_DEPARTMENT_OTHER): Payer: 59 | Admitting: Internal Medicine

## 2023-07-05 ENCOUNTER — Other Ambulatory Visit (HOSPITAL_COMMUNITY): Payer: Self-pay

## 2023-07-05 ENCOUNTER — Telehealth (HOSPITAL_BASED_OUTPATIENT_CLINIC_OR_DEPARTMENT_OTHER): Payer: Self-pay | Admitting: *Deleted

## 2023-07-05 VITALS — BP 130/72 | HR 80 | Ht 62.0 in | Wt 145.3 lb

## 2023-07-05 DIAGNOSIS — I6523 Occlusion and stenosis of bilateral carotid arteries: Secondary | ICD-10-CM

## 2023-07-05 DIAGNOSIS — E7849 Other hyperlipidemia: Secondary | ICD-10-CM | POA: Diagnosis not present

## 2023-07-05 DIAGNOSIS — R931 Abnormal findings on diagnostic imaging of heart and coronary circulation: Secondary | ICD-10-CM | POA: Diagnosis not present

## 2023-07-05 NOTE — Telephone Encounter (Signed)
July 05, 2023 Art Buff, CPhT  to Me     07/05/23  4:05 PM PA request has been Submitted. New Encounter created for follow up. For additional info see Pharmacy Prior Auth telephone encounter from 07/05/23.

## 2023-07-05 NOTE — Telephone Encounter (Signed)
Patient seen by Dr Rennis Golden today and would like to start PCSK9, whichever is covered  Will forward to prior auth team to initiate

## 2023-07-05 NOTE — Patient Instructions (Signed)
Medication Instructions:  WILL TRY TO GET YOU ON PCSK9   *If you need a refill on your cardiac medications before your next appointment, please call your pharmacy*  Lab Work: FASTING LPa/NMR SOON   If you have labs (blood work) drawn today and your tests are completely normal, you will receive your results only by: MyChart Message (if you have MyChart) OR A paper copy in the mail If you have any lab test that is abnormal or we need to change your treatment, we will call you to review the results.  Testing/Procedures: NONE  Follow-Up: At [facility], you and your health needs are our priority.  As part of our continuing mission to provide you with exceptional heart care, we have created designated Provider Care Teams.  These Care Teams include your primary Cardiologist (physician) and Advanced Practice Providers (APPs -  Physician Assistants and Nurse Practitioners) who all work together to provide you with the care you need, when you need it.  We recommend signing up for the patient portal called "MyChart".  Sign up information is provided on this After Visit Summary.  MyChart is used to connect with patients for Virtual Visits (Telemedicine).  Patients are able to view lab/test results, encounter notes, upcoming appointments, etc.  Non-urgent messages can be sent to your provider as well.   To learn more about what you can do with MyChart, go to [email or web address].    Your next appointment:   4 month(s)  Provider:   K. Italy Hilty, MD

## 2023-07-05 NOTE — Telephone Encounter (Signed)
Pharmacy Patient Advocate Encounter   Received notification from Pt Calls Messages that prior authorization for repatha is required/requested.   Insurance verification completed.   The patient is insured through [facility] .   Per test claim: PA required; PA submitted to above mentioned insurance via CoverMyMeds Key/confirmation #/EOC [id number] Status is pending

## 2023-07-06 ENCOUNTER — Other Ambulatory Visit (HOSPITAL_COMMUNITY): Payer: Self-pay

## 2023-07-06 NOTE — Telephone Encounter (Signed)
Pharmacy Patient Advocate Encounter  Received notification from [facility] that Prior Authorization for repatha has been APPROVED from 07/05/23 to 07/04/24. Ran test claim, Copay is $35.00- one month. This test claim was processed through [facility]- copay amounts may vary at other pharmacies due to pharmacy/plan contracts, or as the patient moves through the different stages of their insurance plan.   PA #/Case ID/Reference #: [id number]

## 2023-07-06 NOTE — Telephone Encounter (Signed)
Art Buff, CPhT     07/06/23  8:11 AM Note Pharmacy Patient Advocate Encounter   Received notification from [facility] that Prior Authorization for repatha has been APPROVED from 07/05/23 to 07/04/24. Ran test claim, Copay is $35.00- one month. This test claim was processed through [facility]- copay amounts may vary at other pharmacies due to pharmacy/plan contracts, or as the patient moves through the different stages of their insurance plan.    PA #/Case ID/Reference #: [id number]       Mychart message sent to patient

## 2023-07-06 NOTE — Telephone Encounter (Signed)
Update on PA sent to patient via MyChart

## 2023-07-06 NOTE — Telephone Encounter (Signed)
MyChart message sent to patient.

## 2023-07-07 NOTE — Progress Notes (Signed)
LIPID CLINIC CONSULT NOTE  Chief Complaint:  Manage dyslipidemia  Primary Care Physician: Shon Hale, MD  Primary Cardiologist:  None  HPI:  Hayley Stewart is a 57 y.o. female who is being seen today for the evaluation of dyslipidemia at the request of Shon Hale, *.  This is a pleasant 57 year old female kindly referred for evaluation management of dyslipidemia.  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  Most recently she had significantly elevated cholesterol with a total 288 and LDL 207 with HDL of 67.  Also she had a history of elevated coronary artery calcium with a score of 222 which ran in the 90-100 percentile back in 2021 when she had previously seen Dr. Jacinto Halim for evaluation.  She did undergo carotid Dopplers which showed some mild atherosclerosis as well as a stress test and echocardiogram at the time for evaluation of an SVT which she was also having.  That seems to have resolved.  She had previously tried statin therapy including atorvastatin which caused muscle aches and diarrhea.  More recently she was switched to rosuvastatin and seems to be tolerating 10 mg daily.  She was contacted about the systemwide genetic testing and is interested in pursuing that.  She has an appointment for that on December 6.  There is history of heart disease and high cholesterol in the family.  PMHx:  Past Medical History:  Diagnosis Date   Adenomatous colon polyp 10/2016   Elevated cholesterol    Laryngopharyngeal reflux    Dr. Jenne Pane (ENT)   Thyroid disease     Past Surgical History:  Procedure Laterality Date   BREAST BIOPSY Right 08/20/2003   BREAST EXCISIONAL BIOPSY Right 08/07/2001   BREAST SURGERY     right breast biopsy/FIBROMA OF RT BREAST   COLONOSCOPY  11/01/2016   THYROIDECTOMY, PARTIAL  09/2009   R lobectomy/BENIGN    FAMHx:  Family History  Problem Relation Age of Onset   Hypertension Mother    Hyperlipidemia Mother     Osteoporosis Mother    Hypertension Father    Heart disease Father    Osteoporosis Maternal Aunt    Osteoporosis Maternal Aunt    Colon cancer Paternal Uncle    Breast cancer Maternal Grandmother    Heart disease Maternal Grandfather    Esophageal cancer Neg Hx    Rectal cancer Neg Hx    Stomach cancer Neg Hx    Colon polyps Neg Hx     SOCHx:   reports that she has never smoked. She has been exposed to tobacco smoke. She has never used smokeless tobacco. She reports that she does not currently use alcohol. She reports that she does not use drugs.  ALLERGIES:  Allergies  Allergen Reactions   Lipitor [Atorvastatin] Other (See Comments)    Myalgia and arthralgia and diarrhea   Nitrofurantoin Monohyd Macro Nausea And Vomiting   Penicillins Other (See Comments)    Historical from mother.    ROS: Pertinent items noted in HPI and remainder of comprehensive ROS otherwise negative.  HOME MEDS: Current Outpatient Medications on File Prior to Visit  Medication Sig Dispense Refill   ALPRAZolam (XANAX) 0.25 MG tablet Take 0.25 mg by mouth at bedtime as needed.     estradiol (ESTRACE) 0.5 MG tablet Take 1 tablet (0.5 mg total) by mouth daily. 90 tablet 3   levothyroxine (SYNTHROID) 75 MCG tablet Take 1 tablet (75 mcg total) by mouth daily. Mon-Sat 90 tablet 4  metoprolol tartrate (LOPRESSOR) 25 MG tablet Take 1 tablet (25 mg total) by mouth as needed for up to 15 doses. 15 tablet 0   Multiple Vitamins-Minerals (MULTIVITAMIN WITH MINERALS) tablet Take 1 tablet by mouth daily.     pantoprazole (PROTONIX) 40 MG tablet Take 40 mg by mouth as directed. 2 to 3 times a week     progesterone (PROMETRIUM) 100 MG capsule Take 1 capsule (100 mg total) by mouth daily. 90 capsule 3   rosuvastatin (CRESTOR) 10 MG tablet Take 10 mg by mouth daily.     sertraline (ZOLOFT) 50 MG tablet Take 25-50 mg by mouth daily.     tretinoin (RETIN-A) 0.025 % cream daily as needed.     No current  facility-administered medications on file prior to visit.    LABS/IMAGING: No results found for this or any previous visit (from the past 48 hour(s)). No results found.  LIPID PANEL:    Component Value Date/Time   CHOL 191 06/16/2021 1145   TRIG 108 06/16/2021 1145   HDL 58 06/16/2021 1145   LDLCALC 114 (H) 06/16/2021 1145   LDLDIRECT 81 06/12/2020 1049    WEIGHTS: Wt Readings from Last 3 Encounters:  07/05/23 145 lb 4.8 oz (65.9 kg)  05/30/23 141 lb (64 kg)  08/23/22 142 lb 9.6 oz (64.7 kg)    VITALS: BP 130/72   Pulse 80   Ht 5\' 2"  (1.575 m)   Wt 145 lb 4.8 oz (65.9 kg)   LMP 08/12/2015   BMI 26.58 kg/m   EXAM: Deferred  EKG: Deferred  ASSESSMENT: Dyslipidemia with LDL greater than 190, possible familial hyperlipidemia History of high cholesterol and heart disease in the family Elevated CAC score of 222, 90-100th percentile (2021) -[facility] History of SVT with negative stress testing and no significant echo findings  PLAN: 1.   Hayley Stewart has a significant dyslipidemia and had been intolerant to atorvastatin however has been on rosuvastatin and seems to be doing well.  I would like to reassess her lipids on therapy including an NMR and LP(a).  She may be a good candidate to add PCSK9 inhibitor therapy 2.  Would like to target a 50% reduction in cholesterol and ideally to a goal LDL less than 70.  Plan follow-up with me in about 3 to 4 months after hopefully establishing a PCSK9 inhibitor therapy in addition to her statin.  Thanks again for the kind referral.  Chrystie Nose, MD, [facility]  [place]  [facility]  Medical Director of the Advanced Lipid Disorders &  Cardiovascular Risk Reduction Clinic Diplomate of the American Board of Clinical Lipidology Attending Cardiologist  Direct Dial: [phone]  Fax: [phone]  Website:  [email or web address] [email or web address] Jaster 07/07/2023, 8:45 AM

## 2023-07-08 ENCOUNTER — Other Ambulatory Visit (HOSPITAL_COMMUNITY): Payer: Self-pay | Attending: Medical Genetics

## 2023-07-12 LAB — NMR, LIPOPROFILE
Cholesterol, Total: 159 mg/dL (ref 100–199)
HDL Particle Number: 38.5 umol/L (ref >=30.5)
HDL-C: 63 mg/dL (ref >39)
LDL Particle Number: 897 nmol/L (ref <1000)
LDL Size: 21.2 nmol (ref >20.5)
LDL-C (NIH Calc): 81 mg/dL (ref 0–99)
LP-IR Score: <25 (ref <=45)
Small LDL Particle Number: 379 nmol/L (ref <=527)
Triglycerides: 77 mg/dL (ref 0–149)

## 2023-07-12 LAB — LIPOPROTEIN A (LPA): Lipoprotein (a): 32.5 nmol/L (ref <75.0)

## 2023-07-12 MED ORDER — REPATHA SURECLICK 140 MG/ML ~~LOC~~ SOAJ: 140.0000 mg | SUBCUTANEOUS | 3 refills | Status: DC

## 2023-07-12 NOTE — Addendum Note (Signed)
Addended by: Lindell Spar on: 07/12/2023 07:53 AM   Modules accepted: Orders

## 2023-07-12 NOTE — Telephone Encounter (Signed)
Rx(s) sent to pharmacy electronically.  

## 2023-08-12 ENCOUNTER — Other Ambulatory Visit (HOSPITAL_COMMUNITY)
Admission: RE | Admit: 2023-08-12 | Discharge: 2023-08-12 | Disposition: A | Discharge status: Home / Self Care | Payer: Self-pay | Source: Ambulatory Visit | Attending: Medical Genetics | Admitting: Medical Genetics

## 2023-08-12 DIAGNOSIS — Z006 Encounter for examination for normal comparison and control in clinical research program: Secondary | ICD-10-CM | POA: Insufficient documentation

## 2023-08-16 ENCOUNTER — Ambulatory Visit (INDEPENDENT_AMBULATORY_CARE_PROVIDER_SITE_OTHER): Payer: Self-pay | Admitting: Surgical

## 2023-08-16 DIAGNOSIS — Z719 Counseling, unspecified: Secondary | ICD-10-CM

## 2023-08-16 NOTE — Progress Notes (Signed)
 Botulinum Toxin Procedure Note  Procedure: Cosmetic botulinum toxin  Pre-operative Diagnosis: Dynamic rhytides  Post-operative Diagnosis: Same  Complications:  None  Brief history: The patient desires botulinum toxin injection.  She is aware of the risks including bleeding, damage to deeper structures, asymmetry, brow ptosis, eyelid ptosis, bruising. The patient understands and wishes to proceed.  Procedure: The area was prepped with alcohol and dried with a clean gauze.  Using a clean technique the botulinum toxin was diluted with 2.5 mL of bacteriostatic saline per 100 unit vial which resulted in 4 units per 0.1 mL.  Subsequently the mixture was injected in the glabellar, lateral canthal lines, forehead area with preservation of the temporal branch to the lateral eyebrow. A total of 30 Units of botulinum toxin was used. The forehead and glabellar area was injected with care to inject intramuscular only while holding pressure on the supratrochlear vessels in each area during each injection on either side of the medial corrugators. The injection proceeded vertically superiorly to the medial 2/3 of the frontalis muscle and superior 2/3 of the lateral frontalis, again with preservation of the frontal branch.  No complications were noted. Light pressure was held for 5 minutes. She was instructed explicitly in post-operative care.  Botox LOT:  [id number] EXP:  2026/10

## 2023-08-23 LAB — GENECONNECT MOLECULAR SCREEN: Genetic Analysis Overall Interpretation: NEGATIVE

## 2023-10-25 ENCOUNTER — Encounter (HOSPITAL_BASED_OUTPATIENT_CLINIC_OR_DEPARTMENT_OTHER): Payer: Self-pay | Admitting: Internal Medicine

## 2023-10-25 ENCOUNTER — Other Ambulatory Visit: Payer: Self-pay | Admitting: Nurse Practitioner

## 2023-10-25 DIAGNOSIS — Z0000 Encounter for general adult medical examination without abnormal findings: Secondary | ICD-10-CM

## 2023-11-29 ENCOUNTER — Ambulatory Visit: Attending: Internal Medicine | Admitting: Internal Medicine

## 2023-11-29 ENCOUNTER — Encounter: Payer: Self-pay | Admitting: Internal Medicine

## 2023-11-29 VITALS — BP 110/80 | HR 79 | Ht 62.0 in | Wt 142.4 lb

## 2023-11-29 DIAGNOSIS — R931 Abnormal findings on diagnostic imaging of heart and coronary circulation: Secondary | ICD-10-CM

## 2023-11-29 DIAGNOSIS — E7849 Other hyperlipidemia: Secondary | ICD-10-CM | POA: Diagnosis not present

## 2023-11-29 MED ORDER — ROSUVASTATIN CALCIUM 20 MG PO TABS: 20.0000 mg | ORAL_TABLET | Freq: Every day | ORAL | 3 refills | Status: DC

## 2023-11-29 NOTE — Progress Notes (Signed)
 LIPID CLINIC CONSULT NOTE  Chief Complaint:  Follow-up dyslipidemia  Primary Care Physician: Ransom Byers, MD  Primary Cardiologist:  None  HPI:  Hayley Stewart is a 58 y.o. female who is being seen today for the evaluation of dyslipidemia at the request of Ransom Byers, *.  This is a pleasant 58 year old female kindly referred for evaluation management of dyslipidemia.  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  Most recently she had significantly elevated cholesterol with a total 288 and LDL 207 with HDL of 67.  Also she had a history of elevated coronary artery calcium  with a score of 222 which ran in the 90-100 percentile back in 2021 when she had previously seen Dr. Berry Bristol for evaluation.  She did undergo carotid Dopplers which showed some mild atherosclerosis as well as a stress test and echocardiogram at the time for evaluation of an SVT which she was also having.  That seems to have resolved.  She had previously tried statin therapy including atorvastatin  which caused muscle aches and diarrhea.  More recently she was switched to rosuvastatin  and seems to be tolerating 10 mg daily.  She was contacted about the systemwide genetic testing and is interested in pursuing that.  She has an appointment for that on December 6.  There is history of heart disease and high cholesterol in the family.  11/29/2023  Hayley Stewart returns today for follow-up of dyslipidemia.  She has done well with marked improvement in her cholesterol in combination with Repatha  and rosuvastatin .  LP(a) is negative.  Her [facility] profile showed LDL particle number of 897, LDL 81, HDL 63 and triglycerides 77.  Small LDL particle #379.  Her target LDL is less than 70.  Overall she seems to be doing well and tolerating the medications.  PMHx:  Past Medical History:  Diagnosis Date   Adenomatous colon polyp 10/2016   Elevated cholesterol    Laryngopharyngeal reflux    Dr. Tellis Feathers (ENT)    Thyroid  disease     Past Surgical History:  Procedure Laterality Date   BREAST BIOPSY Right 08/20/2003   BREAST EXCISIONAL BIOPSY Right 08/07/2001   BREAST SURGERY     right breast biopsy/FIBROMA OF RT BREAST   COLONOSCOPY  11/01/2016   THYROIDECTOMY, PARTIAL  09/2009   R lobectomy/BENIGN    FAMHx:  Family History  Problem Relation Age of Onset   Hypertension Mother    Hyperlipidemia Mother    Osteoporosis Mother    Hypertension Father    Heart disease Father    Osteoporosis Maternal Aunt    Osteoporosis Maternal Aunt    Colon cancer Paternal Uncle    Breast cancer Maternal Grandmother    Heart disease Maternal Grandfather    Esophageal cancer Neg Hx    Rectal cancer Neg Hx    Stomach cancer Neg Hx    Colon polyps Neg Hx     SOCHx:   reports that she has never smoked. She has been exposed to tobacco smoke. She has never used smokeless tobacco. She reports that she does not currently use alcohol. She reports that she does not use drugs.  ALLERGIES:  Allergies  Allergen Reactions   Lipitor [Atorvastatin ] Other (See Comments)    Myalgia and arthralgia and diarrhea   Nitrofurantoin Monohyd Macro Nausea And Vomiting   Penicillins Other (See Comments)    Historical from mother.    ROS: Pertinent items noted in HPI and remainder of comprehensive ROS otherwise negative.  HOME MEDS: Current Outpatient Medications on File Prior to Visit  Medication Sig Dispense Refill   ALPRAZolam  (XANAX ) 0.25 MG tablet Take 0.25 mg by mouth at bedtime as needed.     estradiol  (ESTRACE ) 0.5 MG tablet Take 1 tablet (0.5 mg total) by mouth daily. 90 tablet 3   Evolocumab  (REPATHA  SURECLICK) 140 MG/ML SOAJ Inject 140 mg into the skin every 14 (fourteen) days. 6 mL 3   levothyroxine  (SYNTHROID ) 75 MCG tablet Take 1 tablet (75 mcg total) by mouth daily. Mon-Sat 90 tablet 4   metoprolol  tartrate (LOPRESSOR ) 25 MG tablet Take 1 tablet (25 mg total) by mouth as needed for up to 15 doses. 15  tablet 0   Multiple Vitamins-Minerals (MULTIVITAMIN WITH MINERALS) tablet Take 1 tablet by mouth daily.     pantoprazole (PROTONIX) 40 MG tablet Take 40 mg by mouth as directed. 2 to 3 times a week     progesterone  (PROMETRIUM ) 100 MG capsule Take 1 capsule (100 mg total) by mouth daily. 90 capsule 3   rosuvastatin  (CRESTOR ) 10 MG tablet Take 10 mg by mouth daily.     sertraline (ZOLOFT) 50 MG tablet Take 25-50 mg by mouth daily.     tretinoin (RETIN-A) 0.025 % cream daily as needed.     No current facility-administered medications on file prior to visit.    LABS/IMAGING: No results found for this or any previous visit (from the past 48 hours). No results found.  LIPID PANEL:    Component Value Date/Time   CHOL 191 06/16/2021 1145   TRIG 108 06/16/2021 1145   HDL 58 06/16/2021 1145   LDLCALC 114 (H) 06/16/2021 1145   LDLDIRECT 81 06/12/2020 1049    WEIGHTS: Wt Readings from Last 3 Encounters:  11/29/23 142 lb 6.4 oz (64.6 kg)  07/05/23 145 lb 4.8 oz (65.9 kg)  05/30/23 141 lb (64 kg)    VITALS: BP 110/80 (BP Location: Left Arm, Patient Position: Sitting, Cuff Size: Normal)   Pulse 79   Ht 5\' 2"  (1.575 m)   Wt 142 lb 6.4 oz (64.6 kg)   LMP 08/12/2015   SpO2 99%   BMI 26.05 kg/m   EXAM: Deferred  EKG: Deferred  ASSESSMENT: Dyslipidemia with LDL greater than 190, possible familial hyperlipidemia History of high cholesterol and heart disease in the family Elevated CAC score of 222, 90-100th percentile (2021) -[facility] History of SVT with negative stress testing and no significant echo findings  PLAN: 1.   Hayley Stewart has done well with Repatha  in addition to her statin however her LDL is still somewhat above target.  This testing was in December and may have improved somewhat.  We discussed either rechecking that or considering a small increase in her rosuvastatin  up to 20 mg daily.  She would be okay with trying that.  I advised her to watch for any potential side  effects.  Will plan repeat lipids in about 6 months but then can follow-up annually or sooner as necessary.  Hazle Lites, MD, [facility], FNLA, FACP  [place]  [facility]  Medical Director of the Advanced Lipid Disorders &  Cardiovascular Risk Reduction Clinic Diplomate of the American Board of Clinical Lipidology Attending Cardiologist  Direct Dial: [phone]  Fax: [phone]  Website:  [email or web address]  Hazle Lites 11/29/2023, 2:31 PM

## 2023-11-29 NOTE — Patient Instructions (Signed)
 Medication Instructions:  INCREASE rosuvastatin  (crestor ) to 20mg  daily   *If you need a refill on your cardiac medications before your next appointment, please call your pharmacy*  Lab Work: FASTING lab work in 6 months  If you have labs (blood work) drawn today and your tests are completely normal, you will receive your results only by: [employer] (if you have MyChart) OR A paper copy in the mail If you have any lab test that is abnormal or we need to change your treatment, we will call you to review the results.  Follow-Up: At [facility], you and your health needs are our priority.  As part of our continuing mission to provide you with exceptional heart care, our providers are all part of one team.  This team includes your primary Cardiologist (physician) and Advanced Practice Providers or APPs (Physician Assistants and Nurse Practitioners) who all work together to provide you with the care you need, when you need it.  Your next appointment:    12 months with Dr. Maximo Spar  We recommend signing up for the patient portal called "MyChart".  Sign up information is provided on this After Visit Summary.  MyChart is used to connect with patients for Virtual Visits (Telemedicine).  Patients are able to view lab/test results, encounter notes, upcoming appointments, etc.  Non-urgent messages can be sent to your provider as well.   To learn more about what you can do with MyChart, go to [email or web address].

## 2023-12-02 ENCOUNTER — Ambulatory Visit

## 2023-12-09 ENCOUNTER — Ambulatory Visit (INDEPENDENT_AMBULATORY_CARE_PROVIDER_SITE_OTHER): Payer: Self-pay | Admitting: Surgical

## 2023-12-09 DIAGNOSIS — Z719 Counseling, unspecified: Secondary | ICD-10-CM

## 2023-12-09 NOTE — Progress Notes (Signed)
 Botulinum Toxin Procedure Note  Procedure: Cosmetic botulinum toxin  Pre-operative Diagnosis: Dynamic rhytides  Post-operative Diagnosis: Same  Complications:  None  Brief history: The patient desires botulinum toxin injection.  She is aware of the risks including bleeding, damage to deeper structures, asymmetry, brow ptosis, eyelid ptosis, bruising. The patient understands and wishes to proceed.  Procedure: The area was prepped with alcohol and dried with a clean gauze.  Using a clean technique the botulinum toxin was diluted with 2.5 mL of bacteriostatic saline per 100 unit vial which resulted in 4 units per 0.1 mL.  Subsequently the mixture was injected in the glabellar, lateral canthal lines, forehead area with preservation of the temporal branch to the lateral eyebrow. A total of 30 Units of botulinum toxin was used. The forehead and glabellar area was injected with care to inject intramuscular only while holding pressure on the supratrochlear vessels in each area during each injection on either side of the medial corrugators. The injection proceeded vertically superiorly to the medial 2/3 of the frontalis muscle and superior 2/3 of the lateral frontalis, again with preservation of the frontal branch.  No complications were noted. She was instructed explicitly in post-operative care.  Botox LOT:  [id number] EXP:  09/2025

## 2023-12-30 IMAGING — MG MM DIGITAL SCREENING BILAT W/ TOMO AND CAD
6 of 10 series · 6 of 30 positions shown · non-contrast
Comparison: Previous exam(s).

CLINICAL DATA: Screening.

EXAM:
DIGITAL SCREENING BILATERAL MAMMOGRAM WITH TOMOSYNTHESIS AND CAD
TECHNIQUE: Bilateral screening digital craniocaudal and mediolateral oblique
mammograms were obtained. Bilateral screening digital breast
tomosynthesis was performed. The images were evaluated with
computer-aided detection.

[L CC synth-2D (1 of 2)]
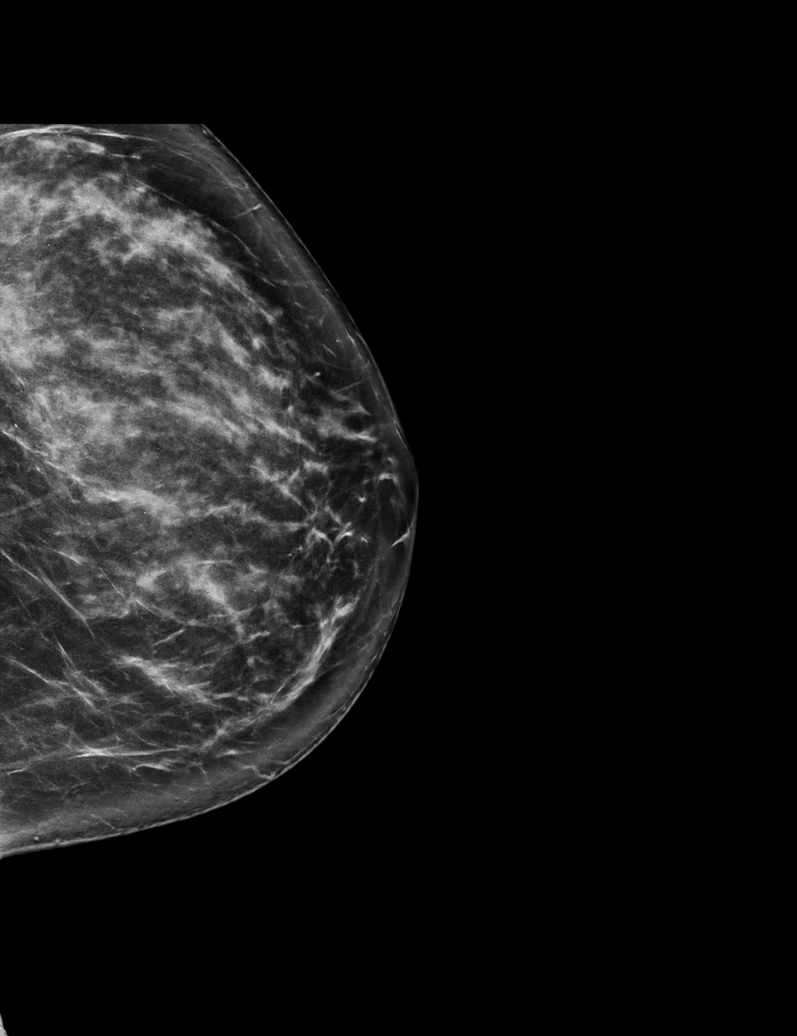

[L CC synth-2D (2 of 2)]
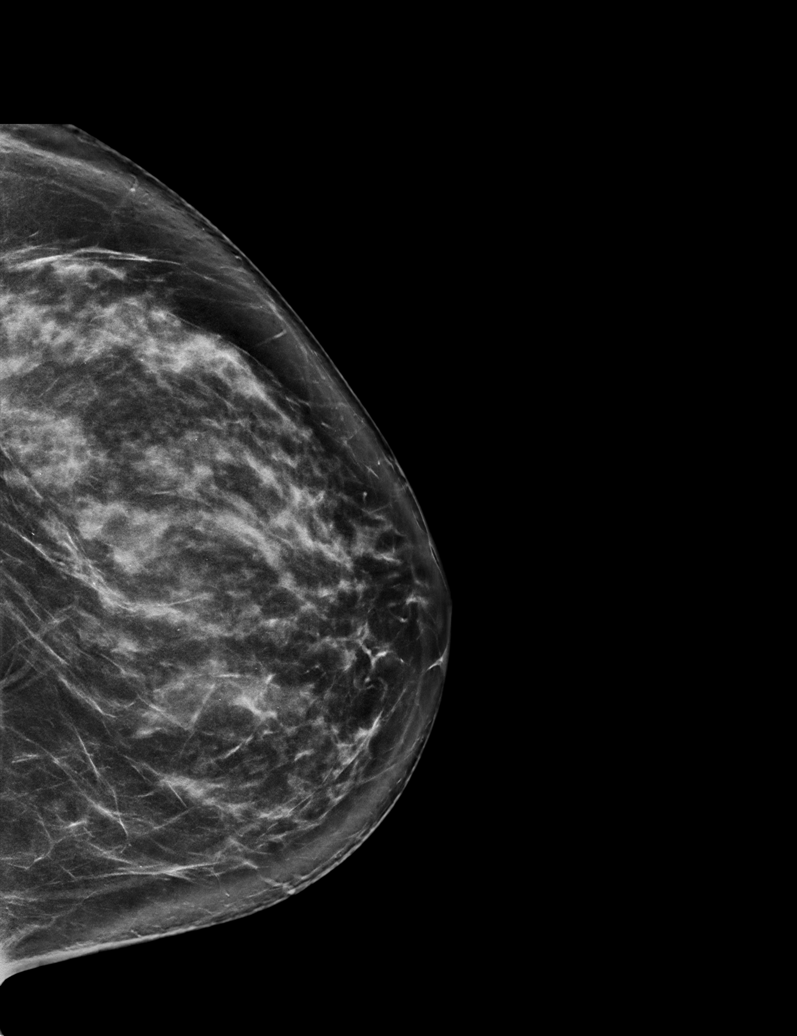

[L MLO synth-2D]
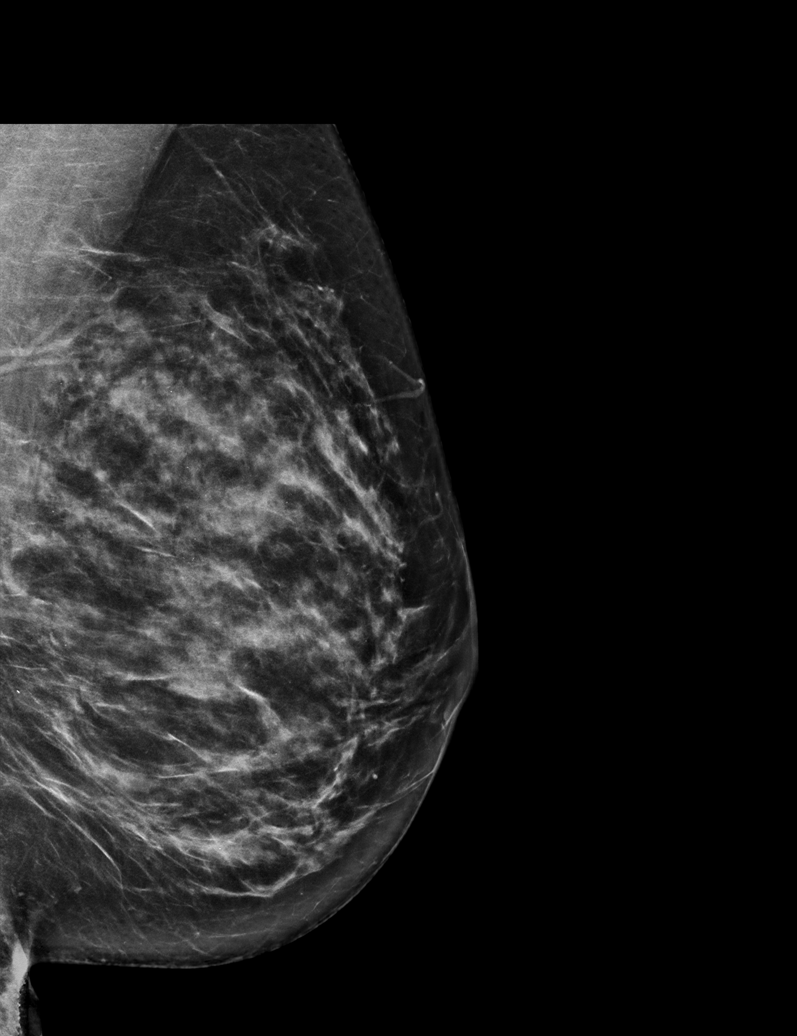

[R CC synth-2D]
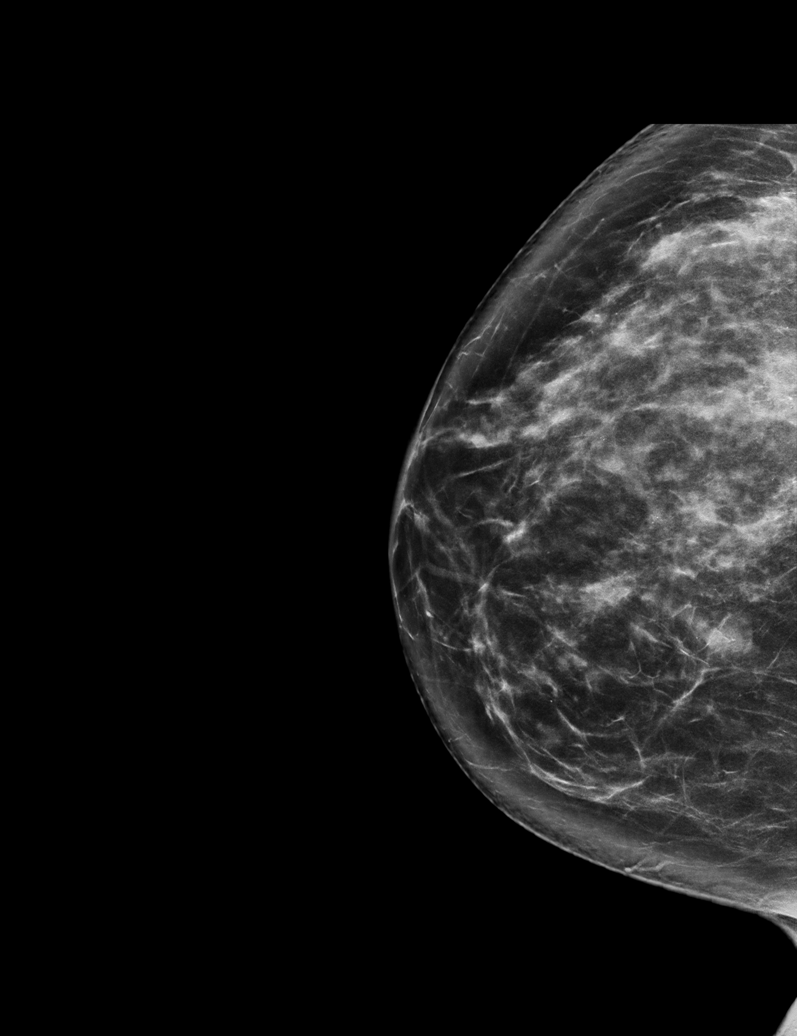

[R MLO synth-2D]
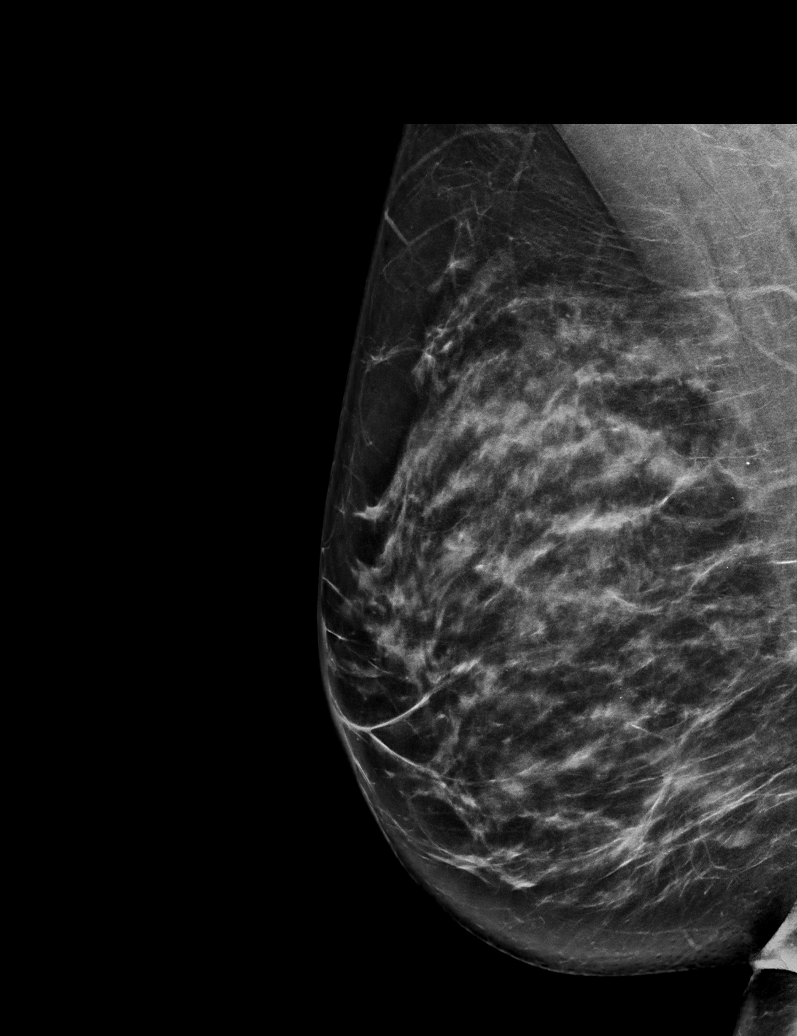

[R MLO tomo · tomo slice 37/73.0]
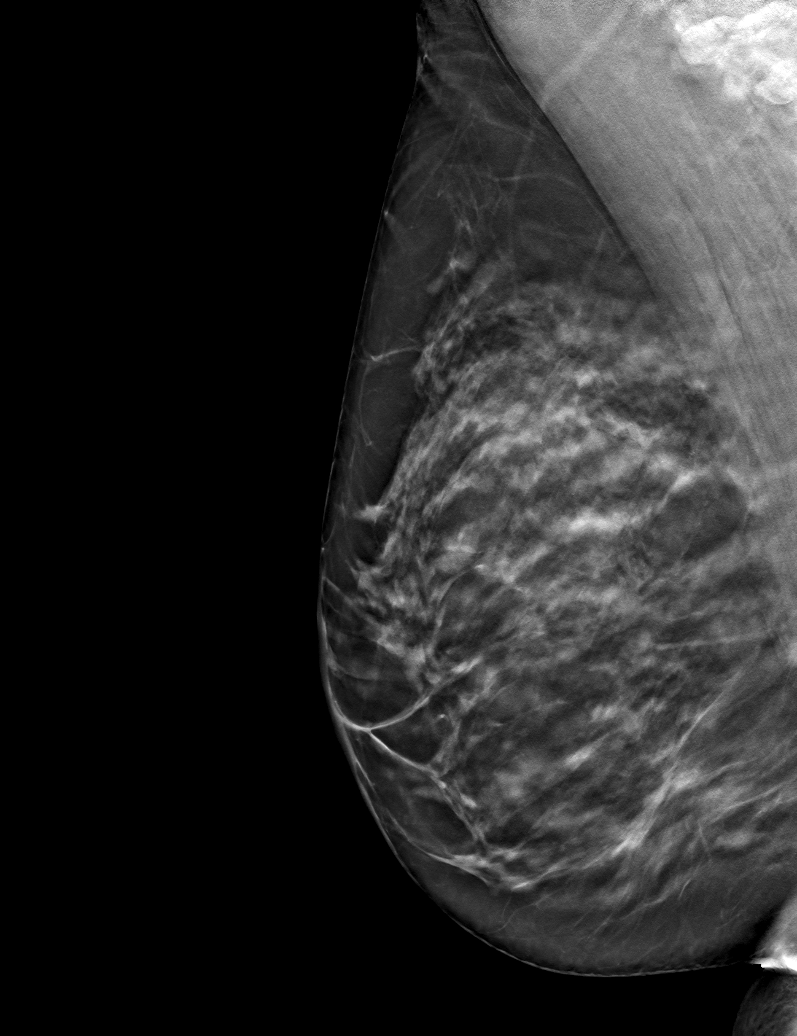

[6 of 30 positions shown; findings below may reference images not displayed]

ACR Breast Density Category c: The breast tissue is heterogeneously
dense, which may obscure small masses.
FINDINGS: There are no findings suspicious for malignancy.
IMPRESSION: No mammographic evidence of malignancy. A result letter of this
screening mammogram will be mailed directly to the patient.

RECOMMENDATION:
Screening mammogram in one year. (Code:Q3-W-BC3)

BI-RADS CATEGORY  1: Negative.

## 2024-01-03 ENCOUNTER — Encounter: Payer: Self-pay | Admitting: Internal Medicine

## 2024-01-17 ENCOUNTER — Other Ambulatory Visit: Payer: Self-pay

## 2024-01-17 DIAGNOSIS — Z79890 Hormone replacement therapy (postmenopausal): Secondary | ICD-10-CM

## 2024-01-17 MED ORDER — PROGESTERONE MICRONIZED 100 MG PO CAPS: 100.0000 mg | ORAL_CAPSULE | Freq: Every day | ORAL | 1 refills | Status: DC

## 2024-01-17 MED ORDER — ESTRADIOL 0.5 MG PO TABS: 0.5000 mg | ORAL_TABLET | Freq: Every day | ORAL | 1 refills | Status: DC

## 2024-01-17 NOTE — Telephone Encounter (Signed)
 Med refill request: estradiol  0.5 mg and progesterone  100mg  Patient has new mail order pharmacy.  [facility]  Last AEX: 05/30/23 Next AEX: none scheduled Last MMG (if hormonal med) 11/19/22 BI-RADS 1 negative Refills authorized.  Please approve or deny as appropriate.

## 2024-01-18 ENCOUNTER — Other Ambulatory Visit: Payer: Self-pay

## 2024-01-18 MED ORDER — ROSUVASTATIN CALCIUM 20 MG PO TABS: 20.0000 mg | ORAL_TABLET | Freq: Every day | ORAL | 3 refills | Status: AC

## 2024-01-20 ENCOUNTER — Other Ambulatory Visit: Payer: Self-pay

## 2024-01-20 DIAGNOSIS — Z79890 Hormone replacement therapy (postmenopausal): Secondary | ICD-10-CM

## 2024-01-20 MED ORDER — PROGESTERONE MICRONIZED 100 MG PO CAPS: 100.0000 mg | ORAL_CAPSULE | Freq: Every day | ORAL | 1 refills | Status: DC

## 2024-01-20 MED ORDER — ESTRADIOL 0.5 MG PO TABS: 0.5000 mg | ORAL_TABLET | Freq: Every day | ORAL | 1 refills | Status: DC

## 2024-01-20 NOTE — Telephone Encounter (Signed)
      RF sent 01/17/24 for estradiol  0.5 mg and progesterone  100 mg to [facility] in [place].  Patient has new mail order pharmacy.  Will send to Jami NP for new RXs. Last AEX 05/30/23  Next AEX: non scheduled Last MMG 11/19/22 BI-RADS 1 negative. Refills authorized. Sent to provider for review.

## 2024-02-06 ENCOUNTER — Other Ambulatory Visit: Payer: Self-pay | Admitting: Pharmacist

## 2024-02-06 MED ORDER — REPATHA SURECLICK 140 MG/ML ~~LOC~~ SOAJ: 140.0000 mg | SUBCUTANEOUS | 3 refills | Status: DC

## 2024-02-22 ENCOUNTER — Ambulatory Visit
Admission: RE | Admit: 2024-02-22 | Discharge: 2024-02-22 | Disposition: A | Discharge status: Home / Self Care | Source: Ambulatory Visit | Attending: Nurse Practitioner | Admitting: Nurse Practitioner

## 2024-02-22 DIAGNOSIS — Z0000 Encounter for general adult medical examination without abnormal findings: Secondary | ICD-10-CM

## 2024-03-12 ENCOUNTER — Ambulatory Visit
Admission: RE | Admit: 2024-03-12 | Discharge: 2024-03-12 | Disposition: A | Discharge status: Home / Self Care | Source: Ambulatory Visit | Attending: Nurse Practitioner | Admitting: Nurse Practitioner

## 2024-03-12 ENCOUNTER — Other Ambulatory Visit: Payer: Self-pay | Admitting: Nurse Practitioner

## 2024-03-12 DIAGNOSIS — M7989 Other specified soft tissue disorders: Secondary | ICD-10-CM

## 2024-05-01 ENCOUNTER — Ambulatory Visit (INDEPENDENT_AMBULATORY_CARE_PROVIDER_SITE_OTHER): Payer: Self-pay | Admitting: Surgical

## 2024-05-01 DIAGNOSIS — Z719 Counseling, unspecified: Secondary | ICD-10-CM

## 2024-05-01 NOTE — Progress Notes (Signed)
 Botulinum Toxin Procedure Note  Procedure: Cosmetic botulinum toxin  Pre-operative Diagnosis: Dynamic rhytides  Post-operative Diagnosis: Same  Complications:  None  Brief history: The patient desires botulinum toxin injection.  She is aware of the risks including bleeding, damage to deeper structures, asymmetry, brow ptosis, eyelid ptosis, bruising. The patient understands and wishes to proceed.  Procedure: The area was prepped with alcohol and dried with a clean gauze.  Using a clean technique the botulinum toxin was diluted with 2.5 mL of bacteriostatic saline per 100 unit vial which resulted in 4 units per 0.1 mL.  Subsequently the mixture was injected in the glabellar, lateral canthal lines, forehead area with preservation of the temporal branch to the lateral eyebrow. A total of 30 Units of botulinum toxin was used. The forehead and glabellar area was injected with care to inject intramuscular only while holding pressure on the supratrochlear vessels in each area during each injection on either side of the medial corrugators. The injection proceeded vertically superiorly to the medial 2/3 of the frontalis muscle and superior 2/3 of the lateral frontalis, again with preservation of the frontal branch.  No complications were noted. Light pressure was held. She was instructed explicitly in post-operative care.  Botox LOT:  [id number] EXP:  05/2026

## 2024-05-09 LAB — NMR, LIPOPROFILE
Cholesterol, Total: 106 mg/dL (ref 100–199)
HDL Particle Number: 40 umol/L (ref >=30.5)
HDL-C: 61 mg/dL (ref >39)
LDL Particle Number: 375 nmol/L (ref <1000)
LDL Size: 19.7 nm — ABNORMAL LOW (ref >20.5)
LDL-C (NIH Calc): 31 mg/dL (ref 0–99)
LP-IR Score: 29 (ref <=45)
Small LDL Particle Number: 161 nmol/L (ref <=527)
Triglycerides: 64 mg/dL (ref 0–149)

## 2024-05-10 ENCOUNTER — Ambulatory Visit: Payer: Self-pay | Admitting: Internal Medicine

## 2024-05-15 ENCOUNTER — Ambulatory Visit (INDEPENDENT_AMBULATORY_CARE_PROVIDER_SITE_OTHER): Payer: Self-pay | Admitting: Surgical

## 2024-05-15 DIAGNOSIS — Z719 Counseling, unspecified: Secondary | ICD-10-CM

## 2024-05-15 NOTE — Progress Notes (Signed)
 Preoperative Dx: hyperpigmentation/sun damage, redness, facial vessels  Postoperative Dx:  same  Procedure: laser to face   Anesthesia: none  Description of Procedure:  Risks and complications were explained to the patient. Consent was confirmed and signed. Time out was called and all information was confirmed to be correct. The area  area was prepped with alcohol and wiped dry.              The patient tolerated the procedure well and there were no complications. The patient is to follow up in 4 weeks.

## 2024-05-22 ENCOUNTER — Other Ambulatory Visit (HOSPITAL_BASED_OUTPATIENT_CLINIC_OR_DEPARTMENT_OTHER): Payer: Self-pay | Admitting: Internal Medicine

## 2024-05-22 DIAGNOSIS — E7800 Pure hypercholesterolemia, unspecified: Secondary | ICD-10-CM

## 2024-05-22 DIAGNOSIS — E7849 Other hyperlipidemia: Secondary | ICD-10-CM

## 2024-05-22 DIAGNOSIS — R931 Abnormal findings on diagnostic imaging of heart and coronary circulation: Secondary | ICD-10-CM

## 2024-05-31 ENCOUNTER — Ambulatory Visit: Admitting: Nurse Practitioner

## 2024-06-05 ENCOUNTER — Other Ambulatory Visit: Payer: Self-pay | Admitting: Radiology

## 2024-06-05 DIAGNOSIS — Z79890 Hormone replacement therapy (postmenopausal): Secondary | ICD-10-CM

## 2024-06-06 ENCOUNTER — Ambulatory Visit (INDEPENDENT_AMBULATORY_CARE_PROVIDER_SITE_OTHER): Admitting: Nurse Practitioner

## 2024-06-06 ENCOUNTER — Encounter: Payer: Self-pay | Admitting: Nurse Practitioner

## 2024-06-06 VITALS — BP 108/70 | Resp 16 | Ht 60.75 in | Wt 138.0 lb

## 2024-06-06 DIAGNOSIS — Z1331 Encounter for screening for depression: Secondary | ICD-10-CM | POA: Diagnosis not present

## 2024-06-06 DIAGNOSIS — Z01419 Encounter for gynecological examination (general) (routine) without abnormal findings: Secondary | ICD-10-CM

## 2024-06-06 DIAGNOSIS — Z79890 Hormone replacement therapy (postmenopausal): Secondary | ICD-10-CM | POA: Diagnosis not present

## 2024-06-06 NOTE — Progress Notes (Signed)
 Hayley Stewart 1965-11-06 [id number]   History:  58 y.o. G1P1 presents for annual exam. Postmenopausal - on HRT with good relief of hot flashes, no bleeding. She stopped HRT a couple of years ago and did not tolerate. Normal pap history. Thyroidectomy around 10 years ago, managed by endocrinology. HLD managed by PCP.   Gynecologic History Contraception: post menopausal status Sexually active: Yes  Health Maintenance Last Pap: 05/27/2022. Results were: ASCUS neg HPV, 3-year repeat Last mammogram: 02/22/2024. Results were: Normal Last colonoscopy: 12/09/2021. Results were: Normal, 10-year recall Last Dexa: Not indicated     06/06/2024   11:58 AM  Depression screen PHQ 2/9  Decreased Interest 0  Down, Depressed, Hopeless 0  PHQ - 2 Score 0     Past medical history, past surgical history, family history and social history were all reviewed and documented in the EPIC chart. Married. 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  ROS:  A ROS was performed and pertinent positives and negatives are included.  Exam:  Vitals:   06/06/24 1152  BP: 108/70  Resp: 16  SpO2: 97%  Weight: 138 lb (62.6 kg)  Height: 5' 0.75 (1.543 m)       Body mass index is 26.29 kg/m.  General appearance:  Normal Thyroid :  Symmetrical, normal in size, without palpable masses or nodularity. Respiratory  Auscultation:  Clear without wheezing or rhonchi Cardiovascular  Auscultation:  Regular rate, without rubs, murmurs or gallops  Edema/varicosities:  Not grossly evident Abdominal  Soft,nontender, without masses, guarding or rebound.  Liver/spleen:  No organomegaly noted  Hernia:  None appreciated  Skin  Inspection:  Grossly normal   Breasts: Examined lying and sitting.   Right: Without masses, retractions, discharge or axillary adenopathy.   Left: Without masses, retractions, discharge or axillary  adenopathy. Pelvic: External genitalia:  no lesions              Urethra:  normal appearing urethra with no masses, tenderness or lesions              Bartholins and Skenes: normal                 Vagina: normal appearing vagina with normal color and discharge, no lesions              Cervix: no lesions Bimanual Exam:  Uterus:  no masses or tenderness              Adnexa: no mass, fullness, tenderness              Rectovaginal: Deferred              Anus:  normal, no lesions  Hayley Stewart, CMA present as chaperone.   Assessment/Plan:  58 y.o. G1P1 for annual exam.   Well female exam with routine gynecological exam -  Education provided on SBEs, importance of preventative screenings, current guidelines, high calcium  diet, regular exercise, and multivitamin daily. Labs with PCP.   Postmenopausal hormone therapy - Plan: estradiol  (ESTRACE ) 0.5 MG tablet daily, progesterone  (PROMETRIUM ) 100 MG capsule nightly. Did not tolerate discontinuing a couple of years ago. Aware of risks and benefits with use.  Screening for cervical cancer - 2023 ASCUS neg HPV, normal Pap history otherwise. Will repeat at 3-year interval per guidelines.   Screening for breast cancer - Normal mammogram history.  Continue annual screenings.  Normal breast exam today.  Screening for  colon cancer - 11/2021 colonoscopy. Will repeat at GI's recommended interval.   Screening for osteoporosis - Mother with history of osteoporosis. Recommend DXA.   Return in about 1 year (around 06/06/2025) for Annual.     Hayley Stewart, 12:14 PM 06/06/2024

## 2024-06-06 NOTE — Telephone Encounter (Signed)
.  Med refill request: Estradiol  and Progesterone  Last AEX: 05/30/23 Next AEX: 06/06/24 Last MMG (if hormonal med) 02/27/24 Refill authorized: Please Advise?

## 2024-06-12 ENCOUNTER — Other Ambulatory Visit (HOSPITAL_COMMUNITY): Payer: Self-pay

## 2024-06-15 ENCOUNTER — Other Ambulatory Visit: Payer: Self-pay | Admitting: Surgical

## 2024-06-19 ENCOUNTER — Other Ambulatory Visit (HOSPITAL_COMMUNITY): Payer: Self-pay

## 2024-07-05 ENCOUNTER — Other Ambulatory Visit: Payer: Self-pay | Admitting: Surgical

## 2024-07-17 ENCOUNTER — Other Ambulatory Visit: Payer: Self-pay | Admitting: Surgical

## 2024-08-03 ENCOUNTER — Ambulatory Visit: Payer: Self-pay | Admitting: Student

## 2024-08-03 DIAGNOSIS — Q859 Phakomatosis, unspecified: Secondary | ICD-10-CM | POA: Insufficient documentation

## 2024-08-03 DIAGNOSIS — L820 Inflamed seborrheic keratosis: Secondary | ICD-10-CM | POA: Insufficient documentation

## 2024-08-03 DIAGNOSIS — D2370 Other benign neoplasm of skin of unspecified lower limb, including hip: Secondary | ICD-10-CM | POA: Insufficient documentation

## 2024-08-03 DIAGNOSIS — E7800 Pure hypercholesterolemia, unspecified: Secondary | ICD-10-CM | POA: Insufficient documentation

## 2024-08-03 DIAGNOSIS — Z719 Counseling, unspecified: Secondary | ICD-10-CM

## 2024-08-03 DIAGNOSIS — L814 Other melanin hyperpigmentation: Secondary | ICD-10-CM | POA: Insufficient documentation

## 2024-08-03 DIAGNOSIS — D225 Melanocytic nevi of trunk: Secondary | ICD-10-CM | POA: Insufficient documentation

## 2024-08-03 DIAGNOSIS — K219 Gastro-esophageal reflux disease without esophagitis: Secondary | ICD-10-CM | POA: Insufficient documentation

## 2024-08-03 NOTE — Progress Notes (Signed)
 Preoperative Dx: Hyperpigmentation to the face  Postoperative Dx:  same  Procedure: laser to face   Anesthesia: BLT cream - 20% benzocaine, 4% lidocaine, 1% tetracaine   Description of Procedure:  Risks and complications were explained to the patient. Consent was confirmed. Time out was called and all information was confirmed to be correct. The area  area was prepped with alcohol and wiped dry.  Ultrasound gel was applied to the base.  BBL laser was set at 2.5 J/cm2 using 532+ nm filter with 3 width and 25 degrees Celsius and an overlap of 20. The face was lasered.  A cherry angioma to the left cheek was then addressed.  The laser was set to 8.5 J/cm with a pulse width of 20 and a cooling temperature of 15 C using the 532+ filter.  The cherry angioma was lasered.  Several light pigmented lesions were then targeted using the 532 filter at a fluence of 8.5 J/cm, pulse width of 20 and a cooling temperature of 15 C.  The patient tolerated the procedure well and there were no complications. The patient is to follow up in 4 weeks.

## 2024-08-09 ENCOUNTER — Other Ambulatory Visit: Payer: Self-pay | Admitting: Surgical

## 2024-09-27 ENCOUNTER — Other Ambulatory Visit: Payer: Self-pay | Admitting: Student
